# Patient Record
Sex: Female | Born: 1995
Health system: Southern US, Community
[De-identification: ages and names within clinical notes are randomized; demographics above are authoritative.]

## PROBLEM LIST (undated history)

## (undated) DIAGNOSIS — E669 Obesity, unspecified: Secondary | ICD-10-CM

## (undated) DIAGNOSIS — J45909 Unspecified asthma, uncomplicated: Secondary | ICD-10-CM

## (undated) DIAGNOSIS — E039 Hypothyroidism, unspecified: Secondary | ICD-10-CM

## (undated) DIAGNOSIS — L309 Dermatitis, unspecified: Secondary | ICD-10-CM

## (undated) DIAGNOSIS — R7303 Prediabetes: Secondary | ICD-10-CM

## (undated) DIAGNOSIS — E559 Vitamin D deficiency, unspecified: Secondary | ICD-10-CM

## (undated) DIAGNOSIS — T7840XA Allergy, unspecified, initial encounter: Secondary | ICD-10-CM

## (undated) DIAGNOSIS — M549 Dorsalgia, unspecified: Secondary | ICD-10-CM

## (undated) HISTORY — DX: Obesity, unspecified: E66.9

## (undated) HISTORY — PX: NO PAST SURGERIES: SHX2092

## (undated) HISTORY — DX: Vitamin D deficiency, unspecified: E55.9

## (undated) HISTORY — DX: Dorsalgia, unspecified: M54.9

## (undated) HISTORY — DX: Hypothyroidism, unspecified: E03.9

## (undated) HISTORY — DX: Allergy, unspecified, initial encounter: T78.40XA

## (undated) HISTORY — DX: Prediabetes: R73.03

---
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
## 2014-10-19 ENCOUNTER — Encounter (HOSPITAL_COMMUNITY): Payer: Self-pay | Admitting: *Deleted

## 2014-10-19 ENCOUNTER — Emergency Department (HOSPITAL_COMMUNITY)
Admission: EM | Admit: 2014-10-19 | Discharge: 2014-10-19 | Disposition: A | Discharge status: Home / Self Care | Payer: 59 | Attending: Emergency Medicine | Admitting: Emergency Medicine

## 2014-10-19 DIAGNOSIS — Y929 Unspecified place or not applicable: Secondary | ICD-10-CM | POA: Insufficient documentation

## 2014-10-19 DIAGNOSIS — Z79899 Other long term (current) drug therapy: Secondary | ICD-10-CM | POA: Insufficient documentation

## 2014-10-19 DIAGNOSIS — R21 Rash and other nonspecific skin eruption: Secondary | ICD-10-CM | POA: Diagnosis not present

## 2014-10-19 DIAGNOSIS — J45901 Unspecified asthma with (acute) exacerbation: Secondary | ICD-10-CM | POA: Insufficient documentation

## 2014-10-19 DIAGNOSIS — R000 Tachycardia, unspecified: Secondary | ICD-10-CM | POA: Diagnosis not present

## 2014-10-19 DIAGNOSIS — T7840XA Allergy, unspecified, initial encounter: Secondary | ICD-10-CM | POA: Insufficient documentation

## 2014-10-19 DIAGNOSIS — Y9389 Activity, other specified: Secondary | ICD-10-CM | POA: Insufficient documentation

## 2014-10-19 DIAGNOSIS — Z872 Personal history of diseases of the skin and subcutaneous tissue: Secondary | ICD-10-CM | POA: Diagnosis not present

## 2014-10-19 DIAGNOSIS — Y998 Other external cause status: Secondary | ICD-10-CM | POA: Diagnosis not present

## 2014-10-19 DIAGNOSIS — X58XXXA Exposure to other specified factors, initial encounter: Secondary | ICD-10-CM | POA: Diagnosis not present

## 2014-10-19 MED ORDER — DIPHENHYDRAMINE HCL 50 MG/ML IJ SOLN
50.0000 mg | Freq: Once | INTRAMUSCULAR | Status: AC
Start: 2014-10-19 — End: 2014-10-19
  Administered 2014-10-19: 50 mg via INTRAVENOUS
  Filled 2014-10-19: qty 1

## 2014-10-19 MED ORDER — SODIUM CHLORIDE 0.9 % IV SOLN
Freq: Once | INTRAVENOUS | Status: AC
  Administered 2014-10-19: 22:00:00 via INTRAVENOUS

## 2014-10-19 MED ORDER — DEXAMETHASONE SODIUM PHOSPHATE 10 MG/ML IJ SOLN
10.0000 mg | Freq: Once | INTRAMUSCULAR | Status: AC
  Administered 2014-10-19: 10 mg via INTRAVENOUS
  Filled 2014-10-19: qty 1

## 2014-10-19 MED ORDER — EPINEPHRINE 0.3 MG/0.3ML IJ SOAJ
0.3000 mg | Freq: Once | INTRAMUSCULAR | Status: AC
  Administered 2014-10-19: 0.3 mg via INTRAMUSCULAR
  Filled 2014-10-19: qty 0.3

## 2014-10-19 NOTE — ED Notes (Signed)
Fine raised rash over her body only itching on the lt side of her face along the jaw line.  No swelling of her tongue or in the back of her throat at present.  This began  approx 1930.Marland Kitchen.  Pt slow to respond to questions asked??????

## 2014-10-19 NOTE — ED Provider Notes (Signed)
CSN: [id number]     Arrival date & time 10/19/14  2042 History   First MD Initiated Contact with Patient 10/19/14 2101     Chief Complaint  Patient presents with  . Allergic Reaction    HPI Pt arrives to ED c/o allergic reaction. States she ate a burger with tomatoe and lettuce. Pts family states she called them and said her tongue was swollen and she was feeling SOB. Family brought her to ED. Pt states she took benadryl. Pt is lethargic, has hives and tongue is WNL. Past Medical History  Diagnosis Date  . Asthma   . Eczema    History reviewed. No pertinent past surgical history. No family history on file. History  Substance Use Topics  . Smoking status: Never Smoker   . Smokeless tobacco: Not on file  . Alcohol Use: No   OB History    No data available     Review of Systems  All other systems reviewed and are negative  Allergies  Shellfish allergy  Home Medications   Prior to Admission medications   Medication Sig Start Date End Date Taking? Authorizing Provider  albuterol (PROVENTIL) (2.5 MG/3ML) 0.083% nebulizer solution Take 2.5 mg by nebulization every 6 (six) hours as needed for wheezing or shortness of breath.   Yes Historical Provider, MD  diphenhydrAMINE (BENADRYL) 25 mg capsule Take 25 mg by mouth every 6 (six) hours as needed for allergies. For allegies   Yes Historical Provider, MD  albuterol (PROVENTIL HFA;VENTOLIN HFA) 108 (90 BASE) MCG/ACT inhaler Inhale 1-2 puffs into the lungs every 6 (six) hours as needed for wheezing. 08/13/12   Linna Hoff, MD  methylPREDNISolone (MEDROL DOSEPAK) 4 MG tablet follow package directions, take until finished, start on tues. 08/13/12   Linna Hoff, MD   BP 108/50 mmHg  Pulse 101  Temp(Src) 98.1 F (36.7 C) (Oral)  Resp 15  Ht  (1.626 m)  Wt 200 lb (90.719 kg)  BMI 34.31 kg/m2  SpO2 96% Physical Exam  Constitutional: She is oriented to person, place, and time. She appears well-developed and well-nourished. No  distress.  HENT:  Head: Normocephalic and atraumatic.  Eyes: Pupils are equal, round, and reactive to light.  Neck: Normal range of motion.  Cardiovascular: Intact distal pulses.  Tachycardia present.   Pulmonary/Chest: No respiratory distress. She has wheezes.  Abdominal: Normal appearance. She exhibits no distension.  Musculoskeletal: Normal range of motion.  Neurological: She is alert and oriented to person, place, and time. No cranial nerve deficit.  Skin: Skin is warm and dry. Rash (Urticarial) noted.  Psychiatric: She has a normal mood and affect. Her behavior is normal.  Nursing note and vitals reviewed.   ED Course  Procedures (including critical care time) Medications  diphenhydrAMINE (BENADRYL) injection 50 mg (50 mg Intravenous Given 10/19/14 2106)  dexamethasone (DECADRON) injection 10 mg (10 mg Intravenous Given 10/19/14 2107)  EPINEPHrine (EPI-PEN) injection 0.3 mg (0.3 mg Intramuscular Given 10/19/14 2105)  0.9 %  sodium chloride infusion ( Intravenous Stopped 10/19/14 2248)    Labs Review Labs Reviewed - No data to display  Imaging Review No results found.   EKG Interpretation   Date/Time:  Sunday October 19 2014 20:53:28 EDT Ventricular Rate:  100 PR Interval:  154 QRS Duration: 88 QT Interval:  347 QTC Calculation: 447 R Axis:   62 Text Interpretation:  Sinus tachycardia Confirmed by Lolah Coghlan  MD, Mattis Featherly  ([id number]) on 10/19/2014 9:01:51 PM     After  treatment in the ED the patient feels back to baseline and wants to go home. MDM   Final diagnoses:  Allergic reaction, initial encounter        Nelva Nayobert Leyanna Bittman, MD 10/23/14 1529

## 2014-10-19 NOTE — ED Notes (Signed)
The pt has a headache since she was at work

## 2014-10-19 NOTE — ED Notes (Signed)
Pt arrives to ED c/o allergic reaction. States she ate a burger with tomatoe and lettuce. Pts family states she called them and said her tongue was swollen and she was feeling SOB. Family brought her to ED. Pt states she took benadryl. Pt is lethargic, has hives and tongue is WNL.

## 2014-10-19 NOTE — Discharge Instructions (Signed)
Food Allergy °A food allergy occurs from eating something you are sensitive to. Food allergies occur in all age groups. It may be passed to you from your parents (heredity).  °CAUSES  °Some common causes are cow's milk, seafood, eggs, nuts (including peanut butter), wheat, and soybeans. °SYMPTOMS  °Common problems are:  °· Swelling around the mouth. °· An itchy, red rash. °· Hives. °· Vomiting. °· Diarrhea. °Severe allergic reactions are life-threatening. This reaction is called anaphylaxis. It can cause the mouth and throat to swell. This makes it hard to breathe and swallow. In severe reactions, only a small amount of food may be fatal within seconds. °HOME CARE INSTRUCTIONS  °· If you are unsure what caused the reaction, keep a diary of foods eaten and symptoms that followed. Avoid foods that cause reactions. °· If hives or rash are present: °¨ Take medicines as directed. °¨ Use an over-the-counter antihistamine (diphenhydramine) to treat hives and itching as needed. °¨ Apply cold compresses to the skin or take baths in cool water. Avoid hot baths or showers. These will increase the redness and itching. °· If you are severely allergic: °¨ Hospitalization is often required following a severe reaction. °¨ Wear a medical alert bracelet or necklace that describes the allergy. °¨ Carry your anaphylaxis kit or epinephrine injection with you at all times. Both you and your family members should know how to use this. This can be lifesaving if you have a severe reaction. If epinephrine is used, it is important for you to seek immediate medical care or call your local emergency services (911 in [place] When the epinephrine wears off, it can be followed by a delayed reaction, which can be fatal. °· Replace your epinephrine immediately after use in case of another reaction. °· Ask your caregiver for instructions if you have not been taught how to use an epinephrine injection. °· Do not drive until medicines used to treat the  reaction have worn off, unless approved by your caregiver. °SEEK MEDICAL CARE IF:  °· You suspect a food allergy. Symptoms generally happen within 30 minutes of eating a food. °· Your symptoms have not gone away within 2 days. See your caregiver sooner if symptoms are getting worse. °· You develop new symptoms. °· You want to retest yourself with a food or drink you think causes an allergic reaction. Never do this if an anaphylactic reaction to that food or drink has happened before. °· There is a return of the symptoms which brought you to your caregiver. °SEEK IMMEDIATE MEDICAL CARE IF:  °· You have trouble breathing, are wheezing, or you have a tight feeling in your chest or throat. °· You have a swollen mouth, or you have hives, swelling, or itching all over your body. Use your epinephrine injection immediately. This is given into the outside of your thigh, deep into the muscle. Following use of the epinephrine injection, seek help right away. °Seek immediate medical care or call your local emergency services (911 in [place] °MAKE SURE YOU:  °· Understand these instructions. °· Will watch your condition. °· Will get help right away if you are not doing well or get worse. °Document Released: 07/01/2000 Document Revised: 09/26/2011 Document Reviewed: 02/21/2008 °Exit[employer]® Patient Information ©2015 [employer]. This information is not intended to replace advice given to you by your health care provider. Make sure you discuss any questions you have with your health care provider. ° °

## 2014-10-19 NOTE — ED Notes (Signed)
bp remains a little low.  nss added to pt 17600ml/hr

## 2014-12-01 IMAGING — CR DG CHEST 2V
2 series · 2 of 2 positions shown · non-contrast
Comparison: None

CLINICAL DATA: Cough and shortness of breath.

CHEST - 2 VIEW

[view not recorded (1 of 2)]
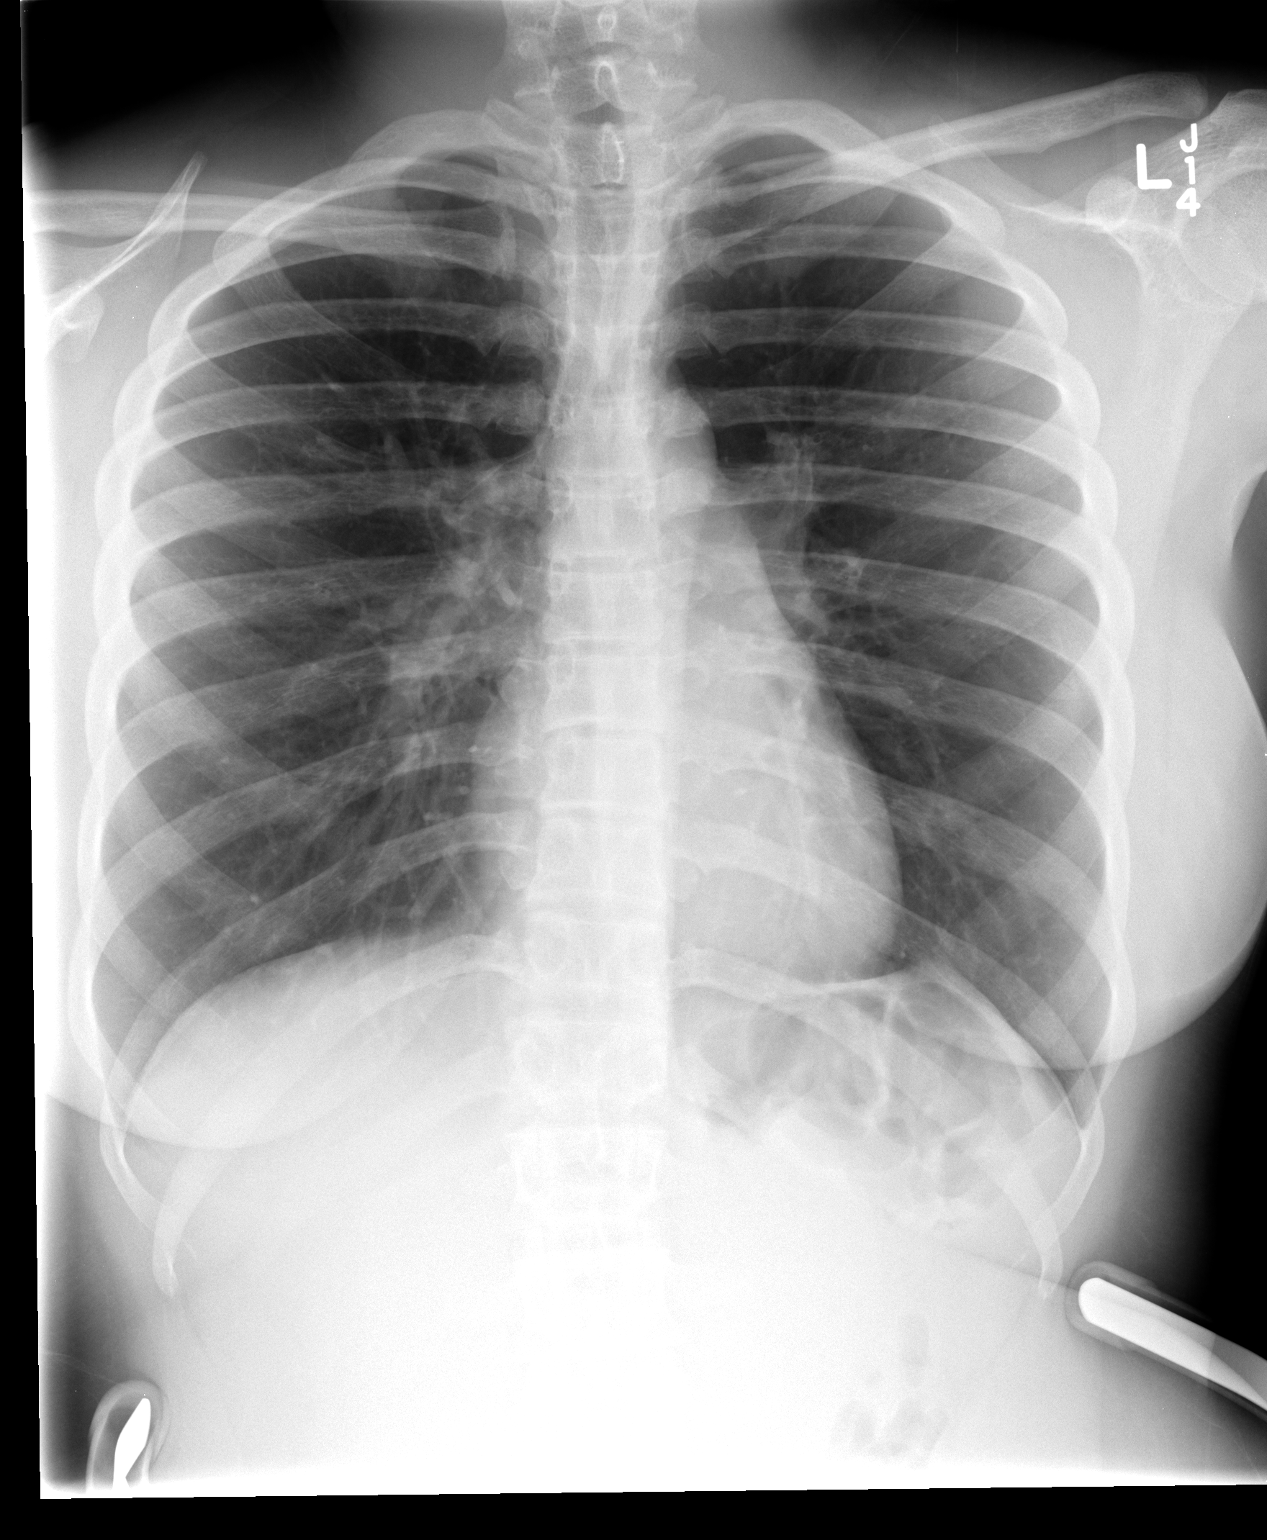

[view not recorded (2 of 2)]
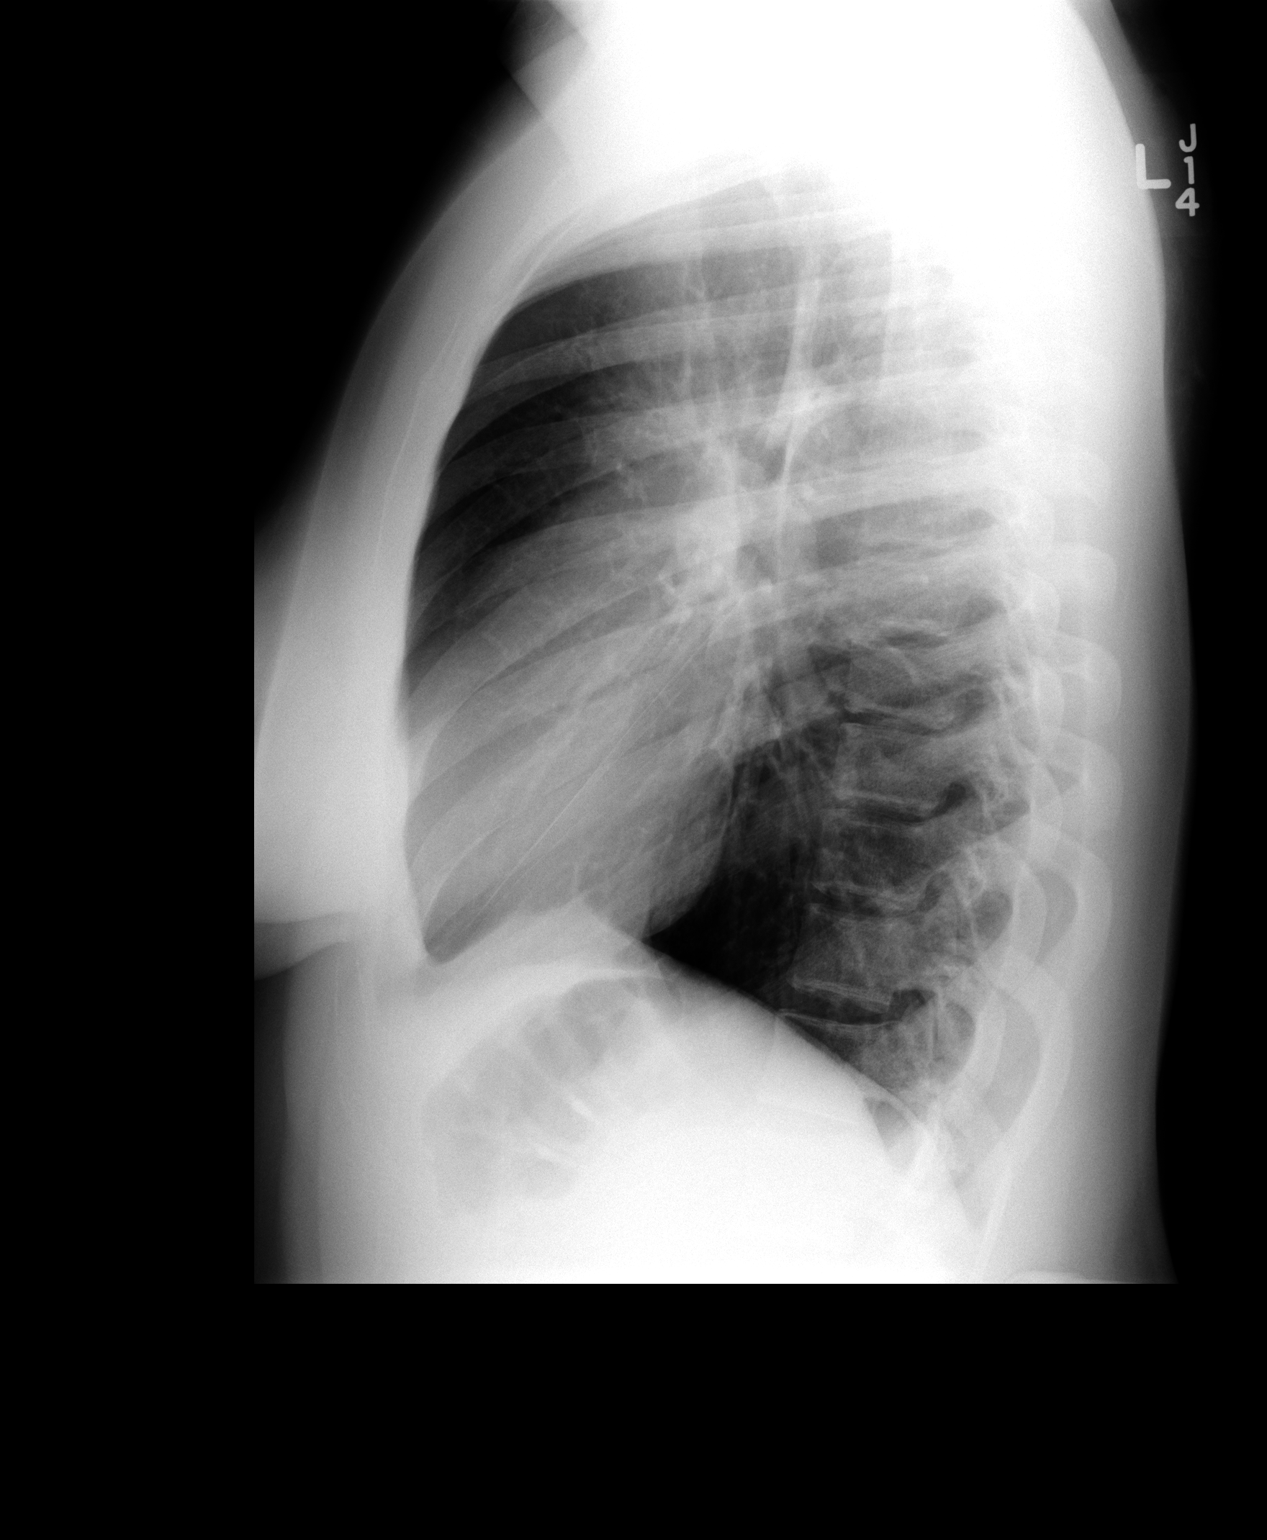

[2 of 2 positions shown; findings below may reference images not displayed]

FINDINGS: The cardiac silhouette, mediastinal and hilar contours
are normal.  The lungs are clear.  Mild hyperinflation. Mild
peribronchial thickening.  No pleural effusion.  The bony thorax is
intact.
IMPRESSION: No pulmonary infiltrates.  Mild hyperinflation and peribronchial
thickening.

## 2015-03-06 ENCOUNTER — Encounter (HOSPITAL_COMMUNITY): Payer: Self-pay | Admitting: Emergency Medicine

## 2015-03-06 ENCOUNTER — Emergency Department (HOSPITAL_COMMUNITY)
Admission: EM | Admit: 2015-03-06 | Discharge: 2015-03-06 | Disposition: A | Discharge status: Home / Self Care | Payer: Self-pay | Attending: Emergency Medicine | Admitting: Emergency Medicine

## 2015-03-06 DIAGNOSIS — Z79899 Other long term (current) drug therapy: Secondary | ICD-10-CM | POA: Insufficient documentation

## 2015-03-06 DIAGNOSIS — Y998 Other external cause status: Secondary | ICD-10-CM | POA: Insufficient documentation

## 2015-03-06 DIAGNOSIS — T782XXA Anaphylactic shock, unspecified, initial encounter: Secondary | ICD-10-CM | POA: Insufficient documentation

## 2015-03-06 DIAGNOSIS — Z872 Personal history of diseases of the skin and subcutaneous tissue: Secondary | ICD-10-CM | POA: Insufficient documentation

## 2015-03-06 DIAGNOSIS — X58XXXA Exposure to other specified factors, initial encounter: Secondary | ICD-10-CM | POA: Insufficient documentation

## 2015-03-06 DIAGNOSIS — Y9389 Activity, other specified: Secondary | ICD-10-CM | POA: Insufficient documentation

## 2015-03-06 DIAGNOSIS — Y9289 Other specified places as the place of occurrence of the external cause: Secondary | ICD-10-CM | POA: Insufficient documentation

## 2015-03-06 DIAGNOSIS — J45909 Unspecified asthma, uncomplicated: Secondary | ICD-10-CM | POA: Insufficient documentation

## 2015-03-06 MED ORDER — SODIUM CHLORIDE 0.9 % IV BOLUS (SEPSIS)
1000.0000 mL | Freq: Once | INTRAVENOUS | Status: AC
  Administered 2015-03-06: 1000 mL via INTRAVENOUS

## 2015-03-06 MED ORDER — SODIUM CHLORIDE 0.9 % IV BOLUS (SEPSIS)
1500.0000 mL | Freq: Once | INTRAVENOUS | Status: AC
  Administered 2015-03-06: 1500 mL via INTRAVENOUS

## 2015-03-06 MED ORDER — EPINEPHRINE 0.3 MG/0.3ML IJ SOAJ: 0.3000 mg | Freq: Once | INTRAMUSCULAR | Status: DC

## 2015-03-06 NOTE — ED Notes (Signed)
Pt stable, ambulatory, states understanding of discharge instructions 

## 2015-03-06 NOTE — ED Notes (Addendum)
Per EMS: Was eating alfredo, suddenly had hives and weakness, allergie to peanuts and tomatoes.  First BP 60 palpated, gave IV fluids, weak radial pulses, nausea. After 500 cc and the epi 0.3 mg IM given, 125 solumedrol, 50 benadryl, 50 zantac, patient's bp increased to 110 systolic.  Great radials now per ems.

## 2015-03-06 NOTE — Discharge Instructions (Signed)
Anaphylactic Reaction °An anaphylactic reaction is a sudden, severe allergic reaction. It affects the whole body. It can be life threatening. You may need to stay in the hospital.  °HOME CARE °· Wear a medical bracelet or necklace that lists your allergy. °· Carry your allergy kit or medicine shot to treat severe allergic reactions with you. These can save your life. °· Do not drive until medicine from your shot has worn off, unless your doctor says it is okay. °· If you have hives or a rash: °¨ Take medicine as told by your doctor. °¨ You may take over-the-counter antihistamine medicine. °¨ Place cold cloths on your skin. Take baths in cool water. Avoid hot baths and hot showers. °GET HELP RIGHT AWAY IF:  °· Your mouth is puffy (swollen), or you have trouble breathing. °· You start making whistling sounds when you breathe (wheezing). °· You have a tight feeling in your chest or throat. °· You have a rash, hives, puffiness, or itching on your body. °· You throw up (vomit) or have watery poop (diarrhea). °· You feel dizzy or pass out (faint). °· You think you are having an allergic reaction. °· You have new symptoms. °This is an emergency. Use your medicine shot or allergy kit as told. Call your local emergency services (911 in [place] Even if you feel better after the shot, you need to go to the hospital emergency department. °MAKE SURE YOU:  °· Understand these instructions. °· Will watch your condition. °· Will get help right away if you are not doing well or get worse. °Document Released: 12/21/2007 Document Revised: 01/03/2012 Document Reviewed: 10/05/2011 °[employer]® Patient Information ©2015 [employer]. This information is not intended to replace advice given to you by your health care provider. Make sure you discuss any questions you have with your health care provider. ° °

## 2015-03-06 NOTE — ED Provider Notes (Signed)
History   Chief Complaint  Patient presents with  . Allergic Reaction     HPI 19 year old female past history of asthma presents to ED after allergic reaction. Patient reports eating chicken Alfredo when she began having bilateral upper extremity hives and LH. Patient denies having any trouble breathing, throat swelling/itching, nausea, vomiting, abdominal pain, syncope. EMS was called on their arrival patient blood pressure 60/40. Patient was given IM epi, IV fluids, Zantac, Benadryl, Solu-Medrol. Patient remained stable during transport and blood pressure improved to 100 systolic. In ED, patient reports she is feeling better now. She reports her arm still feels itchy. She denies having other symptoms currently. She states she has had a similar episode a few months ago while eating a cheeseburger and the cause was never discovered. Never rx'd epi pen. Prior to this in usual state of health.  Past medical/surgical history, social history, medications, allergies and FH have been reviewed with patient and/or in documentation. Furthermore, if pt family or friend(s) present, additional historical information was obtained from them.  Past Medical History  Diagnosis Date  . Asthma   . Eczema    History reviewed. No pertinent past surgical history. No family history on file. Social History  Substance Use Topics  . Smoking status: Never Smoker   . Smokeless tobacco: None  . Alcohol Use: No     Review of Systems Constitutional: - F/C, -fatigue.  HENT: - congestion, -rhinorrhea, -sore throat.   Eyes: - eye pain, -visual disturbance.  Respiratory: - cough, -SOB, -hemoptysis.   Cardiovascular: - CP, -palps.  Gastrointestinal: - N/V/D, -abd pain  Genitourinary: - flank pain, -dysuria, -frequency.  Musculoskeletal: - myalgia/arthritis, -joint swelling, -gait abnormality, -back pain, -neck pain/stiffness, -leg pain/swelling.  Skin: +rash Neurological: - focal weakness, +ightheadedness,  -dizziness, -numbness, -HA.  All other systems reviewed and are negative.   Physical Exam  Physical Exam  ED Triage Vitals  Enc Vitals Group     BP 03/06/15 1748 99/47 mmHg     Pulse Rate 03/06/15 1748 83     Resp 03/06/15 1748 14     Temp 03/06/15 1748 97.9 F (36.6 C)     Temp Source 03/06/15 1748 Oral     SpO2 03/06/15 1748 100 %     Weight --      Height --      Head Cir --      Peak Flow --      Pain Score 03/06/15 1747 6     Pain Loc --      Pain Edu? --      Excl. in GC? --    Constitutional: Patient is well appearing and in no acute distress Head: Normocephalic and atraumatic.  Eyes: Extraocular motion intact, no scleral icterus Mouth: MMM, OP clear without edema Neck: Supple without meningismus, mass, or overt JVD Respiratory: No respiratory distress. Normal WOB. No w/r/g. No stridor CV: RRR, no obvious murmurs.  Pulses +2 and symmetric. Euvolemic Abdomen: Soft, NT, ND, no r/g. No mass.  MSK: Extremities are atraumatic without deformity, ROM intact Skin: Warm, dry, intact. Warm red hives to BLUs. Neuro: AAOx4, MAE 5/5 sym, no focal deficit noted   ED Course  Procedures   Labs Reviewed - No data to display I personally reviewed and interpreted all labs.  No results found. I personally viewed above image(s) which were used in my medical decision making. Formal interpretations by Radiology.   EKG Interpretation  Date/Time:    Ventricular Rate:  PR Interval:    QRS Duration:   QT Interval:    QTC Calculation:   R Axis:     Text Interpretation:         MDM: Cassandra Foley is a 19 y.o. female with H&P as above who p/w CC: allergic rxn  Clinical picture c/f anaphylaxis. Pt is s/p epi by EMS. Pt is well appearing now and BP remains soft. Will continue to give IVF and monitor. Benadryl given. No indication for repeat epi at this time.  After 3L IVF, pt BP stable and pt reports 100% better. Pt has been observed for several hrs since epi and is  now stable for d/c. Advised f/u close with pcp regarding allergy testing. epipen rx given.  Old records reviewed (if available). Labs and imaging reviewed personally by myself and considered in medical decision making if ordered. Clinical Impression: 1. Anaphylaxis, initial encounter     Disposition: Discharge  Condition: Good  I have discussed the results, Dx and Tx plan with the pt(& family if present). He/she/they expressed understanding and agree(s) with the plan. Discharge instructions discussed at great length. Strict return precautions discussed and pt &/or family have verbalized understanding of the instructions. No further questions at time of discharge.    Discharge Medication List as of 03/06/2015  9:08 PM    START taking these medications   Details  EPINEPHrine 0.3 mg/0.3 mL IJ SOAJ injection Inject 0.3 mLs (0.3 mg total) into the muscle once., Starting 03/06/2015, Print        Follow Up: [facility]     [address] [place] [address] [phone]  As needed  [facility] EMERGENCY DEPARTMENT [address] [id number] mc [place] [address] [phone]  If symptoms worsen   Pt seen in conjunction with Dr. Garrel Ridgel, DO [facility] Emergency Medicine Resident - PGY-3      Ames Dura, MD 03/07/15 6644  Arby Barrette, MD 03/12/15 2044

## 2016-03-12 ENCOUNTER — Encounter (HOSPITAL_COMMUNITY): Payer: Self-pay

## 2016-03-12 ENCOUNTER — Emergency Department (HOSPITAL_COMMUNITY)
Admission: EM | Admit: 2016-03-12 | Discharge: 2016-03-12 | Disposition: A | Discharge status: Home / Self Care | Payer: Self-pay | Attending: Emergency Medicine | Admitting: Emergency Medicine

## 2016-03-12 DIAGNOSIS — Z331 Pregnant state, incidental: Secondary | ICD-10-CM | POA: Insufficient documentation

## 2016-03-12 DIAGNOSIS — R519 Headache, unspecified: Secondary | ICD-10-CM

## 2016-03-12 DIAGNOSIS — Z3490 Encounter for supervision of normal pregnancy, unspecified, unspecified trimester: Secondary | ICD-10-CM

## 2016-03-12 DIAGNOSIS — J45909 Unspecified asthma, uncomplicated: Secondary | ICD-10-CM | POA: Insufficient documentation

## 2016-03-12 DIAGNOSIS — Z91010 Allergy to peanuts: Secondary | ICD-10-CM | POA: Insufficient documentation

## 2016-03-12 DIAGNOSIS — R51 Headache: Secondary | ICD-10-CM

## 2016-03-12 LAB — POC URINE PREG, ED: PREG TEST UR: POSITIVE — AB

## 2016-03-12 MED ORDER — METOCLOPRAMIDE HCL 10 MG PO TABS: 10.0000 mg | ORAL_TABLET | Freq: Three times a day (TID) | ORAL | 0 refills | Status: DC | PRN

## 2016-03-12 NOTE — ED Triage Notes (Signed)
Pt. Began having intermittent headaches for over 1 month.  Pt reports being unders stress and feeling anxxiouls  She also has a lot of things going on in her life.  She also could be pregnant.  Pt. Reports taking Ibuprofen which helps a little.  Last night the headache was worse and she was unable to sleep.  She denies any n/v/d denies any blurred vision.  She has photphobia

## 2016-03-12 NOTE — ED Provider Notes (Signed)
[facility]-EMERGENCY DEPT Provider Note   CSN: [id number] Arrival date & time: 03/12/16  1556     History   Chief Complaint Chief Complaint  Patient presents with  . Migraine    HPI Cassandra Foley is a 20 y.o. female.   Migraine  Associated symptoms include headaches.   Pt has been having trouble with a "migraine" for the last month off and on.  She normally does not have headaches. She tried to make an appointment but it is not until the 17th.  No fevers.  No vomiting.  She gets the headache in the front of her head.  She feels like it increases when she has stress or increased activity.    Pt stopped her birth control medications.  She has had intercourse since then.   Past Medical History:  Diagnosis Date  . Asthma   . Eczema     There are no active problems to display for this patient.   History reviewed. No pertinent surgical history.  OB History    No data available       Home Medications    Prior to Admission medications   Medication Sig Start Date End Date Taking? Authorizing Provider  acetaminophen (TYLENOL) 500 MG tablet Take 1,000 mg by mouth every 6 (six) hours as needed for headache.    Historical Provider, MD  albuterol (PROVENTIL HFA;VENTOLIN HFA) 108 (90 BASE) MCG/ACT inhaler Inhale 1-2 puffs into the lungs every 6 (six) hours as needed for wheezing. 08/13/12   Linna HoffJames D Kindl, MD  albuterol (PROVENTIL) (2.5 MG/3ML) 0.083% nebulizer solution Take 2.5 mg by nebulization every 6 (six) hours as needed for wheezing or shortness of breath.    Historical Provider, MD  diphenhydrAMINE (BENADRYL) 25 mg capsule Take 25 mg by mouth every 6 (six) hours as needed for allergies. For allegies    Historical Provider, MD  EPINEPHrine 0.3 mg/0.3 mL IJ SOAJ injection Inject 0.3 mLs (0.3 mg total) into the muscle once. 03/06/15   Ames DuraStephen Balleh, MD  methylPREDNISolone (MEDROL DOSEPAK) 4 MG tablet follow package directions, take until finished, start on tues. 08/13/12   Linna HoffJames  D Kindl, MD  metoCLOPramide (REGLAN) 10 MG tablet Take 1 tablet (10 mg total) by mouth every 8 (eight) hours as needed for nausea. 03/12/16   Linwood DibblesJon Crystelle Ferrufino, MD  montelukast (SINGULAIR) 10 MG tablet Take 10 mg by mouth daily. 01/06/15   Historical Provider, MD    Family History No family history on file.  Social History Social History  Substance Use Topics  . Smoking status: Never Smoker  . Smokeless tobacco: Never Used  . Alcohol use No     Allergies   Shellfish allergy; Peanut-containing drug products; and Tomato   Review of Systems Review of Systems  Constitutional: Negative for fever.  Gastrointestinal: Negative for nausea and vomiting.  Genitourinary:       Menses have been irregular.  Last was end of June beginning of July  Neurological: Positive for headaches. Negative for tremors and seizures.  All other systems reviewed and are negative.    Physical Exam Updated Vital Signs BP (!) 107/50 (BP Location: Left Arm)   Pulse 77   Temp 98.2 F (36.8 C) (Oral)   Resp 16   Wt 107.6 kg   LMP 01/16/2016   SpO2 100%   BMI 40.70 kg/m   Physical Exam  Constitutional: She appears well-developed and well-nourished. No distress.  HENT:  Head: Normocephalic and atraumatic.  Right Ear: External ear normal.  Left Ear: External ear normal.  Eyes: Conjunctivae are normal. Right eye exhibits no discharge. Left eye exhibits no discharge. No scleral icterus.  Neck: Neck supple. No tracheal deviation present.  Cardiovascular: Normal rate, regular rhythm and intact distal pulses.   Pulmonary/Chest: Effort normal and breath sounds normal. No stridor. No respiratory distress. She has no wheezes. She has no rales.  Abdominal: Soft. Bowel sounds are normal. She exhibits no distension. There is no tenderness. There is no rebound and no guarding.  Musculoskeletal: She exhibits no edema or tenderness.  Neurological: She is alert. She has normal strength. No cranial nerve deficit (no facial  droop, extraocular movements intact, no slurred speech) or sensory deficit. She exhibits normal muscle tone. She displays no seizure activity. Coordination normal.  Skin: Skin is warm and dry. No rash noted.  Psychiatric: She has a normal mood and affect.  Nursing note and vitals reviewed.    ED Treatments / Results  Labs (all labs ordered are listed, but only abnormal results are displayed) Labs Reviewed  POC URINE PREG, ED - Abnormal; Notable for the following:       Result Value   Preg Test, Ur POSITIVE (*)    All other components within normal limits    EKG  EKG Interpretation None       Radiology No results found.  Procedures Procedures (including critical care time)  Medications Ordered in ED Medications - No data to display   Initial Impression / Assessment and Plan / ED Course  I have reviewed the triage vital signs and the nursing notes.  Pertinent labs & imaging results that were available during my care of the patient were reviewed by me and considered in my medical decision making (see chart for details).  Clinical Course    Patient has a reassuring exam. Her headache is mild to moderate in nature. She is not having any neck stiffness. She has a normal neurologic exam.  I doubt meningitis, hemorrhage, tumor or other emergent condition.  Her pregnancy test is positive and fortunately she is not having any symptoms to suggest any complications. She denies any abdominal pain. No bleeding.  I explained to patient it safe for her to take Tylenol during pregnancy for her headache. Recommend she follow up with an OB doctor. I'll give her prescription for Reglan which could help if she starts to develop nausea and may help her with her headaches.  Final Clinical Impressions(s) / ED Diagnoses   Final diagnoses:  Frequent headaches  Pregnancy    New Prescriptions New Prescriptions   METOCLOPRAMIDE (REGLAN) 10 MG TABLET    Take 1 tablet (10 mg total) by mouth  every 8 (eight) hours as needed for nausea.     Linwood Dibbles, MD 03/12/16 [id number]

## 2016-03-12 NOTE — Discharge Instructions (Signed)
You can safely take Tylenol for your headache. I'll up with an OB/GYN doctor regarding her pregnancy. Return for fever or vaginal bleeding or other concerning symptoms

## 2016-04-13 ENCOUNTER — Ambulatory Visit (INDEPENDENT_AMBULATORY_CARE_PROVIDER_SITE_OTHER): Payer: Medicaid Other | Admitting: Obstetrics and Gynecology

## 2016-04-13 ENCOUNTER — Encounter: Payer: Self-pay | Admitting: Obstetrics and Gynecology

## 2016-04-13 VITALS — BP 117/74 | HR 83 | Temp 98.5°F | Wt 240.0 lb

## 2016-04-13 DIAGNOSIS — O99212 Obesity complicating pregnancy, second trimester: Secondary | ICD-10-CM | POA: Diagnosis not present

## 2016-04-13 DIAGNOSIS — O99210 Obesity complicating pregnancy, unspecified trimester: Secondary | ICD-10-CM | POA: Insufficient documentation

## 2016-04-13 DIAGNOSIS — Z3400 Encounter for supervision of normal first pregnancy, unspecified trimester: Secondary | ICD-10-CM | POA: Insufficient documentation

## 2016-04-13 DIAGNOSIS — Z3402 Encounter for supervision of normal first pregnancy, second trimester: Secondary | ICD-10-CM | POA: Diagnosis not present

## 2016-04-13 NOTE — Progress Notes (Signed)
Patient refused Flu shot °

## 2016-04-13 NOTE — Progress Notes (Signed)
Patient is not sure about date of LMP, [facility].

## 2016-04-13 NOTE — Patient Instructions (Addendum)
Second Trimester of Pregnancy The second trimester is from week 13 through week 28, months 4 through 6. The second trimester is often a time when you feel your best. Your body has also adjusted to being pregnant, and you begin to feel better physically. Usually, morning sickness has lessened or quit completely, you may have more energy, and you may have an increase in appetite. The second trimester is also a time when the fetus is growing rapidly. At the end of the sixth month, the fetus is about 9 inches long and weighs about 1 pounds. You will likely begin to feel the baby move (quickening) between 18 and 20 weeks of the pregnancy. BODY CHANGES Your body goes through many changes during pregnancy. The changes vary from woman to woman.   Your weight will continue to increase. You will notice your lower abdomen bulging out.  You may begin to get stretch marks on your hips, abdomen, and breasts.  You may develop headaches that can be relieved by medicines approved by your health care provider.  You may urinate more often because the fetus is pressing on your bladder.  You may develop or continue to have heartburn as a result of your pregnancy.  You may develop constipation because certain hormones are causing the muscles that push waste through your intestines to slow down.  You may develop hemorrhoids or swollen, bulging veins (varicose veins).  You may have back pain because of the weight gain and pregnancy hormones relaxing your joints between the bones in your pelvis and as a result of a shift in weight and the muscles that support your balance.  Your breasts will continue to grow and be tender.  Your gums may bleed and may be sensitive to brushing and flossing.  Dark spots or blotches (chloasma, mask of pregnancy) may develop on your face. This will likely fade after the baby is born.  A dark line from your belly button to the pubic area (linea nigra) may appear. This will likely  fade after the baby is born.  You may have changes in your hair. These can include thickening of your hair, rapid growth, and changes in texture. Some women also have hair loss during or after pregnancy, or hair that feels dry or thin. Your hair will most likely return to normal after your baby is born. WHAT TO EXPECT AT YOUR PRENATAL VISITS During a routine prenatal visit:  You will be weighed to make sure you and the fetus are growing normally.  Your blood pressure will be taken.  Your abdomen will be measured to track your baby's growth.  The fetal heartbeat will be listened to.  Any test results from the previous visit will be discussed. Your health care provider may ask you:  How you are feeling.  If you are feeling the baby move.  If you have had any abnormal symptoms, such as leaking fluid, bleeding, severe headaches, or abdominal cramping.  If you are using any tobacco products, including cigarettes, chewing tobacco, and electronic cigarettes.  If you have any questions. Other tests that may be performed during your second trimester include:  Blood tests that check for:  Low iron levels (anemia).  Gestational diabetes (between 24 and 28 weeks).  Rh antibodies.  Urine tests to check for infections, diabetes, or protein in the urine.  An ultrasound to confirm the proper growth and development of the baby.  An amniocentesis to check for possible genetic problems.  Fetal screens for spina bifida   and Down syndrome.  HIV (human immunodeficiency virus) testing. Routine prenatal testing includes screening for HIV, unless you choose not to have this test. HOME CARE INSTRUCTIONS   Avoid all smoking, herbs, alcohol, and unprescribed drugs. These chemicals affect the formation and growth of the baby.  Do not use any tobacco products, including cigarettes, chewing tobacco, and electronic cigarettes. If you need help quitting, ask your health care provider. You may receive  counseling support and other resources to help you quit.  Follow your health care provider's instructions regarding medicine use. There are medicines that are either safe or unsafe to take during pregnancy.  Exercise only as directed by your health care provider. Experiencing uterine cramps is a good sign to stop exercising.  Continue to eat regular, healthy meals.  Wear a good support bra for breast tenderness.  Do not use hot tubs, steam rooms, or saunas.  Wear your seat belt at all times when driving.  Avoid raw meat, uncooked cheese, cat litter boxes, and soil used by cats. These carry germs that can cause birth defects in the baby.  Take your prenatal vitamins.  Take 1500-2000 mg of calcium daily starting at the 20th week of pregnancy until you deliver your baby.  Try taking a stool softener (if your health care provider approves) if you develop constipation. Eat more high-fiber foods, such as fresh vegetables or fruit and whole grains. Drink plenty of fluids to keep your urine clear or pale yellow.  Take warm sitz baths to soothe any pain or discomfort caused by hemorrhoids. Use hemorrhoid cream if your health care provider approves.  If you develop varicose veins, wear support hose. Elevate your feet for 15 minutes, 3-4 times a day. Limit salt in your diet.  Avoid heavy lifting, wear low heel shoes, and practice good posture.  Rest with your legs elevated if you have leg cramps or low back pain.  Visit your dentist if you have not gone yet during your pregnancy. Use a soft toothbrush to brush your teeth and be gentle when you floss.  A sexual relationship may be continued unless your health care provider directs you otherwise.  Continue to go to all your prenatal visits as directed by your health care provider. SEEK MEDICAL CARE IF:   You have dizziness.  You have mild pelvic cramps, pelvic pressure, or nagging pain in the abdominal area.  You have persistent nausea,  vomiting, or diarrhea.  You have a bad smelling vaginal discharge.  You have pain with urination. SEEK IMMEDIATE MEDICAL CARE IF:   You have a fever.  You are leaking fluid from your vagina.  You have spotting or bleeding from your vagina.  You have severe abdominal cramping or pain.  You have rapid weight gain or loss.  You have shortness of breath with chest pain.  You notice sudden or extreme swelling of your face, hands, ankles, feet, or legs.  You have not felt your baby move in over an hour.  You have severe headaches that do not go away with medicine.  You have vision changes.   This information is not intended to replace advice given to you by your health care provider. Make sure you discuss any questions you have with your health care provider.   Document Released: 06/28/2001 Document Revised: 07/25/2014 Document Reviewed: 09/04/2012 Elsevier Interactive Patient Education 2016 [employer]  Contraception Choices Contraception (birth control) is the use of any methods or devices to prevent pregnancy. Below are some methods to   help avoid pregnancy. HORMONAL METHODS   Contraceptive implant. This is a thin, plastic tube containing progesterone hormone. It does not contain estrogen hormone. Your health care provider inserts the tube in the inner part of the upper arm. The tube can remain in place for up to 3 years. After 3 years, the implant must be removed. The implant prevents the ovaries from releasing an egg (ovulation), thickens the cervical mucus to prevent sperm from entering the uterus, and thins the lining of the inside of the uterus.  Progesterone-only injections. These injections are given every 3 months by your health care provider to prevent pregnancy. This synthetic progesterone hormone stops the ovaries from releasing eggs. It also thickens cervical mucus and changes the uterine lining. This makes it harder for sperm to survive in the uterus.  Birth  control pills. These pills contain estrogen and progesterone hormone. They work by preventing the ovaries from releasing eggs (ovulation). They also cause the cervical mucus to thicken, preventing the sperm from entering the uterus. Birth control pills are prescribed by a health care provider.Birth control pills can also be used to treat heavy periods.  Minipill. This type of birth control pill contains only the progesterone hormone. They are taken every day of each month and must be prescribed by your health care provider.  Birth control patch. The patch contains hormones similar to those in birth control pills. It must be changed once a week and is prescribed by a health care provider.  Vaginal ring. The ring contains hormones similar to those in birth control pills. It is left in the vagina for 3 weeks, removed for 1 week, and then a new one is put back in place. The patient must be comfortable inserting and removing the ring from the vagina.A health care provider's prescription is necessary.  Emergency contraception. Emergency contraceptives prevent pregnancy after unprotected sexual intercourse. This pill can be taken right after sex or up to 5 days after unprotected sex. It is most effective the sooner you take the pills after having sexual intercourse. Most emergency contraceptive pills are available without a prescription. Check with your pharmacist. Do not use emergency contraception as your only form of birth control. BARRIER METHODS   Female condom. This is a thin sheath (latex or rubber) that is worn over the penis during sexual intercourse. It can be used with spermicide to increase effectiveness.  Female condom. This is a soft, loose-fitting sheath that is put into the vagina before sexual intercourse.  Diaphragm. This is a soft, latex, dome-shaped barrier that must be fitted by a health care provider. It is inserted into the vagina, along with a spermicidal jelly. It is inserted before  intercourse. The diaphragm should be left in the vagina for 6 to 8 hours after intercourse.  Cervical cap. This is a round, soft, latex or plastic cup that fits over the cervix and must be fitted by a health care provider. The cap can be left in place for up to 48 hours after intercourse.  Sponge. This is a soft, circular piece of polyurethane foam. The sponge has spermicide in it. It is inserted into the vagina after wetting it and before sexual intercourse.  Spermicides. These are chemicals that kill or block sperm from entering the cervix and uterus. They come in the form of creams, jellies, suppositories, foam, or tablets. They do not require a prescription. They are inserted into the vagina with an applicator before having sexual intercourse. The process must be repeated every   time you have sexual intercourse. INTRAUTERINE CONTRACEPTION  Intrauterine device (IUD). This is a T-shaped device that is put in a woman's uterus during a menstrual period to prevent pregnancy. There are 2 types:  Copper IUD. This type of IUD is wrapped in copper wire and is placed inside the uterus. Copper makes the uterus and fallopian tubes produce a fluid that kills sperm. It can stay in place for 10 years.  Hormone IUD. This type of IUD contains the hormone progestin (synthetic progesterone). The hormone thickens the cervical mucus and prevents sperm from entering the uterus, and it also thins the uterine lining to prevent implantation of a fertilized egg. The hormone can weaken or kill the sperm that get into the uterus. It can stay in place for 3-5 years, depending on which type of IUD is used. PERMANENT METHODS OF CONTRACEPTION  Female tubal ligation. This is when the woman's fallopian tubes are surgically sealed, tied, or blocked to prevent the egg from traveling to the uterus.  Hysteroscopic sterilization. This involves placing a small coil or insert into each fallopian tube. Your doctor uses a technique  called hysteroscopy to do the procedure. The device causes scar tissue to form. This results in permanent blockage of the fallopian tubes, so the sperm cannot fertilize the egg. It takes about 3 months after the procedure for the tubes to become blocked. You must use another form of birth control for these 3 months.  Female sterilization. This is when the female has the tubes that carry sperm tied off (vasectomy).This blocks sperm from entering the vagina during sexual intercourse. After the procedure, the man can still ejaculate fluid (semen). NATURAL PLANNING METHODS  Natural family planning. This is not having sexual intercourse or using a barrier method (condom, diaphragm, cervical cap) on days the woman could become pregnant.  Calendar method. This is keeping track of the length of each menstrual cycle and identifying when you are fertile.  Ovulation method. This is avoiding sexual intercourse during ovulation.  Symptothermal method. This is avoiding sexual intercourse during ovulation, using a thermometer and ovulation symptoms.  Post-ovulation method. This is timing sexual intercourse after you have ovulated. Regardless of which type or method of contraception you choose, it is important that you use condoms to protect against the transmission of sexually transmitted infections (STIs). Talk with your health care provider about which form of contraception is most appropriate for you.   This information is not intended to replace advice given to you by your health care provider. Make sure you discuss any questions you have with your health care provider.   Document Released: 07/04/2005 Document Revised: 07/09/2013 Document Reviewed: 12/27/2012 Elsevier Interactive Patient Education 2016 [employer]  Breastfeeding Deciding to breastfeed is one of the best choices you can make for you and your baby. A change in hormones during pregnancy causes your breast tissue to grow and increases the  number and size of your milk ducts. These hormones also allow proteins, sugars, and fats from your blood supply to make breast milk in your milk-producing glands. Hormones prevent breast milk from being released before your baby is born as well as prompt milk flow after birth. Once breastfeeding has begun, thoughts of your baby, as well as his or her sucking or crying, can stimulate the release of milk from your milk-producing glands.  BENEFITS OF BREASTFEEDING For Your Baby  Your first milk (colostrum) helps your baby's digestive system function better.  There are antibodies in your milk that help   your baby fight off infections.  Your baby has a lower incidence of asthma, allergies, and sudden infant death syndrome.  The nutrients in breast milk are better for your baby than infant formulas and are designed uniquely for your baby's needs.  Breast milk improves your baby's brain development.  Your baby is less likely to develop other conditions, such as childhood obesity, asthma, or type 2 diabetes mellitus. For You  Breastfeeding helps to create a very special bond between you and your baby.  Breastfeeding is convenient. Breast milk is always available at the correct temperature and costs nothing.  Breastfeeding helps to burn calories and helps you lose the weight gained during pregnancy.  Breastfeeding makes your uterus contract to its prepregnancy size faster and slows bleeding (lochia) after you give birth.   Breastfeeding helps to lower your risk of developing type 2 diabetes mellitus, osteoporosis, and breast or ovarian cancer later in life. SIGNS THAT YOUR BABY IS HUNGRY Early Signs of Hunger  Increased alertness or activity.  Stretching.  Movement of the head from side to side.  Movement of the head and opening of the mouth when the corner of the mouth or cheek is stroked (rooting).  Increased sucking sounds, smacking lips, cooing, sighing, or squeaking.  Hand-to-mouth  movements.  Increased sucking of fingers or hands. Late Signs of Hunger  Fussing.  Intermittent crying. Extreme Signs of Hunger Signs of extreme hunger will require calming and consoling before your baby will be able to breastfeed successfully. Do not wait for the following signs of extreme hunger to occur before you initiate breastfeeding:  Restlessness.  A loud, strong cry.  Screaming. BREASTFEEDING BASICS Breastfeeding Initiation  Find a comfortable place to sit or lie down, with your neck and back well supported.  Place a pillow or rolled up blanket under your baby to bring him or her to the level of your breast (if you are seated). Nursing pillows are specially designed to help support your arms and your baby while you breastfeed.  Make sure that your baby's abdomen is facing your abdomen.  Gently massage your breast. With your fingertips, massage from your chest wall toward your nipple in a circular motion. This encourages milk flow. You may need to continue this action during the feeding if your milk flows slowly.  Support your breast with 4 fingers underneath and your thumb above your nipple. Make sure your fingers are well away from your nipple and your baby's mouth.  Stroke your baby's lips gently with your finger or nipple.  When your baby's mouth is open wide enough, quickly bring your baby to your breast, placing your entire nipple and as much of the colored area around your nipple (areola) as possible into your baby's mouth.  More areola should be visible above your baby's upper lip than below the lower lip.  Your baby's tongue should be between his or her lower gum and your breast.  Ensure that your baby's mouth is correctly positioned around your nipple (latched). Your baby's lips should create a seal on your breast and be turned out (everted).  It is common for your baby to suck about 2-3 minutes in order to start the flow of breast milk. Latching Teaching  your baby how to latch on to your breast properly is very important. An improper latch can cause nipple pain and decreased milk supply for you and poor weight gain in your baby. Also, if your baby is not latched onto your nipple properly, he   or she may swallow some air during feeding. This can make your baby fussy. Burping your baby when you switch breasts during the feeding can help to get rid of the air. However, teaching your baby to latch on properly is still the best way to prevent fussiness from swallowing air while breastfeeding. Signs that your baby has successfully latched on to your nipple:  Silent tugging or silent sucking, without causing you pain.  Swallowing heard between every 3-4 sucks.  Muscle movement above and in front of his or her ears while sucking. Signs that your baby has not successfully latched on to nipple:  Sucking sounds or smacking sounds from your baby while breastfeeding.  Nipple pain. If you think your baby has not latched on correctly, slip your finger into the corner of your baby's mouth to break the suction and place it between your baby's gums. Attempt breastfeeding initiation again. Signs of Successful Breastfeeding Signs from your baby:  A gradual decrease in the number of sucks or complete cessation of sucking.  Falling asleep.  Relaxation of his or her body.  Retention of a small amount of milk in his or her mouth.  Letting go of your breast by himself or herself. Signs from you:  Breasts that have increased in firmness, weight, and size 1-3 hours after feeding.  Breasts that are softer immediately after breastfeeding.  Increased milk volume, as well as a change in milk consistency and color by the fifth day of breastfeeding.  Nipples that are not sore, cracked, or bleeding. Signs That Your Baby is Getting Enough Milk  Wetting at least 3 diapers in a 24-hour period. The urine should be clear and pale yellow by age 5 days.  At least 3  stools in a 24-hour period by age 5 days. The stool should be soft and yellow.  At least 3 stools in a 24-hour period by age 7 days. The stool should be seedy and yellow.  No loss of weight greater than 10% of birth weight during the first 3 days of age.  Average weight gain of 4-7 ounces (113-198 g) per week after age 4 days.  Consistent daily weight gain by age 5 days, without weight loss after the age of 2 weeks. After a feeding, your baby may spit up a small amount. This is common. BREASTFEEDING FREQUENCY AND DURATION Frequent feeding will help you make more milk and can prevent sore nipples and breast engorgement. Breastfeed when you feel the need to reduce the fullness of your breasts or when your baby shows signs of hunger. This is called "breastfeeding on demand." Avoid introducing a pacifier to your baby while you are working to establish breastfeeding (the first 4-6 weeks after your baby is born). After this time you may choose to use a pacifier. Research has shown that pacifier use during the first year of a baby's life decreases the risk of sudden infant death syndrome (SIDS). Allow your baby to feed on each breast as long as he or she wants. Breastfeed until your baby is finished feeding. When your baby unlatches or falls asleep while feeding from the first breast, offer the second breast. Because newborns are often sleepy in the first few weeks of life, you may need to awaken your baby to get him or her to feed. Breastfeeding times will vary from baby to baby. However, the following rules can serve as a guide to help you ensure that your baby is properly fed:  Newborns (babies 4 weeks   of age or younger) may breastfeed every 1-3 hours.  Newborns should not go longer than 3 hours during the day or 5 hours during the night without breastfeeding.  You should breastfeed your baby a minimum of 8 times in a 24-hour period until you begin to introduce solid foods to your baby at around 6  months of age. BREAST MILK PUMPING Pumping and storing breast milk allows you to ensure that your baby is exclusively fed your breast milk, even at times when you are unable to breastfeed. This is especially important if you are going back to work while you are still breastfeeding or when you are not able to be present during feedings. Your lactation consultant can give you guidelines on how long it is safe to store breast milk. A breast pump is a machine that allows you to pump milk from your breast into a sterile bottle. The pumped breast milk can then be stored in a refrigerator or freezer. Some breast pumps are operated by hand, while others use electricity. Ask your lactation consultant which type will work best for you. Breast pumps can be purchased, but some hospitals and breastfeeding support groups lease breast pumps on a monthly basis. A lactation consultant can teach you how to hand express breast milk, if you prefer not to use a pump. CARING FOR YOUR BREASTS WHILE YOU BREASTFEED Nipples can become dry, cracked, and sore while breastfeeding. The following recommendations can help keep your breasts moisturized and healthy:  Avoid using soap on your nipples.  Wear a supportive bra. Although not required, special nursing bras and tank tops are designed to allow access to your breasts for breastfeeding without taking off your entire bra or top. Avoid wearing underwire-style bras or extremely tight bras.  Air dry your nipples for 3-4minutes after each feeding.  Use only cotton bra pads to absorb leaked breast milk. Leaking of breast milk between feedings is normal.  Use lanolin on your nipples after breastfeeding. Lanolin helps to maintain your skin's normal moisture barrier. If you use pure lanolin, you do not need to wash it off before feeding your baby again. Pure lanolin is not toxic to your baby. You may also hand express a few drops of breast milk and gently massage that milk into your  nipples and allow the milk to air dry. In the first few weeks after giving birth, some women experience extremely full breasts (engorgement). Engorgement can make your breasts feel heavy, warm, and tender to the touch. Engorgement peaks within 3-5 days after you give birth. The following recommendations can help ease engorgement:  Completely empty your breasts while breastfeeding or pumping. You may want to start by applying warm, moist heat (in the shower or with warm water-soaked hand towels) just before feeding or pumping. This increases circulation and helps the milk flow. If your baby does not completely empty your breasts while breastfeeding, pump any extra milk after he or she is finished.  Wear a snug bra (nursing or regular) or tank top for 1-2 days to signal your body to slightly decrease milk production.  Apply ice packs to your breasts, unless this is too uncomfortable for you.  Make sure that your baby is latched on and positioned properly while breastfeeding. If engorgement persists after 48 hours of following these recommendations, contact your health care provider or a lactation consultant. OVERALL HEALTH CARE RECOMMENDATIONS WHILE BREASTFEEDING  Eat healthy foods. Alternate between meals and snacks, eating 3 of each per day. Because what   you eat affects your breast milk, some of the foods may make your baby more irritable than usual. Avoid eating these foods if you are sure that they are negatively affecting your baby.  Drink milk, fruit juice, and water to satisfy your thirst (about 10 glasses a day).  Rest often, relax, and continue to take your prenatal vitamins to prevent fatigue, stress, and anemia.  Continue breast self-awareness checks.  Avoid chewing and smoking tobacco. Chemicals from cigarettes that pass into breast milk and exposure to secondhand smoke may harm your baby.  Avoid alcohol and drug use, including marijuana. Some medicines that may be harmful to your  baby can pass through breast milk. It is important to ask your health care provider before taking any medicine, including all over-the-counter and prescription medicine as well as vitamin and herbal supplements. It is possible to become pregnant while breastfeeding. If birth control is desired, ask your health care provider about options that will be safe for your baby. SEEK MEDICAL CARE IF:  You feel like you want to stop breastfeeding or have become frustrated with breastfeeding.  You have painful breasts or nipples.  Your nipples are cracked or bleeding.  Your breasts are red, tender, or warm.  You have a swollen area on either breast.  You have a fever or chills.  You have nausea or vomiting.  You have drainage other than breast milk from your nipples.  Your breasts do not become full before feedings by the fifth day after you give birth.  You feel sad and depressed.  Your baby is too sleepy to eat well.  Your baby is having trouble sleeping.   Your baby is wetting less than 3 diapers in a 24-hour period.  Your baby has less than 3 stools in a 24-hour period.  Your baby's skin or the white part of his or her eyes becomes yellow.   Your baby is not gaining weight by 5 days of age. SEEK IMMEDIATE MEDICAL CARE IF:  Your baby is overly tired (lethargic) and does not want to wake up and feed.  Your baby develops an unexplained fever.   This information is not intended to replace advice given to you by your health care provider. Make sure you discuss any questions you have with your health care provider.   Document Released: 07/04/2005 Document Revised: 03/25/2015 Document Reviewed: 12/26/2012 Elsevier Interactive Patient Education 2016 [employer]  

## 2016-04-13 NOTE — Progress Notes (Signed)
  Subjective:    Cassandra Foley is a G1P0 369w4d being seen today for her first obstetrical visit.  Her obstetrical history is significant for first pregnancy and obesity. Patient does intend to breast feed. Pregnancy history fully reviewed.  Patient reports no complaints.  Vitals:   04/13/16 1127  BP: 117/74  Pulse: 83  Temp: 98.5 F (36.9 C)  Weight: 240 lb (108.9 kg)    HISTORY: OB History  Gravida Para Term Preterm AB Living  1            SAB TAB Ectopic Multiple Live Births               # Outcome Date GA Lbr Len/2nd Weight Sex Delivery Anes PTL Lv  1 Current              Past Medical History:  Diagnosis Date  . Asthma   . Eczema    History reviewed. No pertinent surgical history. History reviewed. No pertinent family history.   Exam    Uterus:   16-week size uterus  Pelvic Exam:    Perineum: Normal Perineum   Vulva: normal   Vagina:  normal mucosa, normal discharge   pH:    Cervix: nulliparous appearance and closed and long   Adnexa: not evaluated   Bony Pelvis: gynecoid  System: Breast:  normal appearance, no masses or tenderness   Skin: normal coloration and turgor, no rashes    Neurologic: oriented, no focal deficits   Extremities: normal strength, tone, and muscle mass   HEENT extra ocular movement intact   Mouth/Teeth mucous membranes moist, pharynx normal without lesions and dental hygiene good   Neck supple and no masses   Cardiovascular: regular rate and rhythm   Respiratory:  chest clear, no wheezing, crepitations, rhonchi, normal symmetric air entry   Abdomen: soft, non-tender; bowel sounds normal; no masses,  no organomegaly   Urinary:       Assessment:    Pregnancy: G1P0 Patient Active Problem List   Diagnosis Date Noted  . Supervision of normal first pregnancy, antepartum 04/13/2016  . Obesity affecting pregnancy, antepartum 04/13/2016        Plan:     Initial labs drawn. Prenatal vitamins. Problem list reviewed and  updated. Genetic Screening discussed Quad Screen: requested. Will be doen at next visit pending dating  Ultrasound discussed; fetal survey: ordered. Patient declined flu vaccine Patient to return before next appointment for 2 hr glucola and dating ultrasound  Follow up in 4 weeks. 50% of 30 min visit spent on counseling and coordination of care.     Loie Jahr 04/13/2016

## 2016-04-14 MED ORDER — ALBUTEROL SULFATE (2.5 MG/3ML) 0.083% IN NEBU: 2.5000 mg | INHALATION_SOLUTION | Freq: Four times a day (QID) | RESPIRATORY_TRACT | 6 refills | Status: DC | PRN

## 2016-04-14 MED ORDER — ALBUTEROL SULFATE HFA 108 (90 BASE) MCG/ACT IN AERS: 1.0000 | INHALATION_SPRAY | Freq: Four times a day (QID) | RESPIRATORY_TRACT | 1 refills | Status: DC | PRN

## 2016-04-14 MED ORDER — MONTELUKAST SODIUM 10 MG PO TABS: 10.0000 mg | ORAL_TABLET | Freq: Every day | ORAL | 6 refills | Status: DC

## 2016-04-14 NOTE — Addendum Note (Signed)
Addended by: Catalina AntiguaONSTANT, Alianys Chacko on: 04/14/2016 04:29 PM   Modules accepted: Orders

## 2016-04-15 ENCOUNTER — Encounter: Payer: Self-pay | Admitting: Obstetrics and Gynecology

## 2016-04-15 LAB — GC/CHLAMYDIA PROBE AMP
Chlamydia trachomatis, NAA: NEGATIVE
NEISSERIA GONORRHOEAE BY PCR: NEGATIVE

## 2016-04-15 LAB — CULTURE, OB URINE

## 2016-04-15 LAB — URINE CULTURE, OB REFLEX

## 2016-04-19 NOTE — Addendum Note (Signed)
Addended by: Maretta BeesMCGLASHAN, Adar Rase J on: 04/19/2016 03:39 PM   Modules accepted: Orders

## 2016-04-20 ENCOUNTER — Other Ambulatory Visit: Payer: Medicaid Other

## 2016-04-20 DIAGNOSIS — Z3A13 13 weeks gestation of pregnancy: Secondary | ICD-10-CM

## 2016-04-21 LAB — GLUCOSE TOLERANCE, 2 HOURS W/ 1HR
Glucose, 1 hour: 152 mg/dL (ref 65–179)
Glucose, 2 hour: 95 mg/dL (ref 65–152)
Glucose, Fasting: 78 mg/dL (ref 65–91)

## 2016-04-21 LAB — RPR: RPR Ser Ql: NONREACTIVE

## 2016-04-21 LAB — CBC
HEMOGLOBIN: 12.1 g/dL (ref 11.1–15.9)
Hematocrit: 36.3 % (ref 34.0–46.6)
MCH: 31.1 pg (ref 26.6–33.0)
MCHC: 33.3 g/dL (ref 31.5–35.7)
MCV: 93 fL (ref 79–97)
Platelets: 169 10*3/uL (ref 150–379)
RBC: 3.89 x10E6/uL (ref 3.77–5.28)
RDW: 14.9 % (ref 12.3–15.4)
WBC: 7.4 10*3/uL (ref 3.4–10.8)

## 2016-04-21 LAB — HIV ANTIBODY (ROUTINE TESTING W REFLEX): HIV Screen 4th Generation wRfx: NONREACTIVE

## 2016-04-22 LAB — VARICELLA ZOSTER ANTIBODY, IGG: Varicella zoster IgG: 1069 index (ref >165)

## 2016-04-22 LAB — PRENATAL PROFILE I(LABCORP)
ANTIBODY SCREEN: NEGATIVE
Basophils Absolute: 0 10*3/uL (ref 0.0–0.2)
Basos: 1 %
EOS (ABSOLUTE): 0.3 10*3/uL (ref 0.0–0.4)
EOS: 5 %
HEMATOCRIT: 35.6 % (ref 34.0–46.6)
HEP B S AG: NEGATIVE
Hemoglobin: 12.4 g/dL (ref 11.1–15.9)
IMMATURE GRANULOCYTES: 0 %
Immature Grans (Abs): 0 10*3/uL (ref 0.0–0.1)
LYMPHS ABS: 1.7 10*3/uL (ref 0.7–3.1)
Lymphs: 26 %
MCH: 31.1 pg (ref 26.6–33.0)
MCHC: 34.8 g/dL (ref 31.5–35.7)
MCV: 89 fL (ref 79–97)
Monocytes Absolute: 0.4 10*3/uL (ref 0.1–0.9)
Monocytes: 5 %
NEUTROS ABS: 4.1 10*3/uL (ref 1.4–7.0)
NEUTROS PCT: 63 %
Platelets: 152 10*3/uL (ref 150–379)
RBC: 3.99 x10E6/uL (ref 3.77–5.28)
RDW: 14.3 % (ref 12.3–15.4)
RH TYPE: POSITIVE
RPR Ser Ql: NONREACTIVE
RUBELLA: 4.67 {index} (ref >0.99)
WBC: 6.6 10*3/uL (ref 3.4–10.8)

## 2016-04-22 LAB — HEMOGLOBINOPATHY EVALUATION
HEMOGLOBIN A2 QUANTITATION: 2.8 % (ref 0.7–3.1)
HEMOGLOBIN F QUANTITATION: 0.9 % (ref 0.0–2.0)
HGB A: 96.3 % (ref 94.0–98.0)
HGB C: 0 %
HGB S: 0 %

## 2016-04-22 LAB — CYSTIC FIBROSIS MUTATION 97: Interpretation: NOT DETECTED

## 2016-04-22 LAB — TOXASSURE SELECT 13 (MW), URINE

## 2016-04-22 LAB — HIV ANTIBODY (ROUTINE TESTING W REFLEX): HIV SCREEN 4TH GENERATION: NONREACTIVE

## 2016-04-25 ENCOUNTER — Ambulatory Visit (HOSPITAL_COMMUNITY)
Admission: RE | Admit: 2016-04-25 | Discharge: 2016-04-25 | Disposition: A | Discharge status: Home / Self Care | Payer: Medicaid Other | Source: Ambulatory Visit | Attending: Obstetrics and Gynecology | Admitting: Obstetrics and Gynecology

## 2016-04-25 ENCOUNTER — Other Ambulatory Visit: Payer: Self-pay | Admitting: Obstetrics and Gynecology

## 2016-04-25 DIAGNOSIS — Z363 Encounter for antenatal screening for malformations: Secondary | ICD-10-CM | POA: Diagnosis present

## 2016-04-25 DIAGNOSIS — O0932 Supervision of pregnancy with insufficient antenatal care, second trimester: Secondary | ICD-10-CM

## 2016-04-25 DIAGNOSIS — Z3A24 24 weeks gestation of pregnancy: Secondary | ICD-10-CM

## 2016-04-25 DIAGNOSIS — Z3402 Encounter for supervision of normal first pregnancy, second trimester: Secondary | ICD-10-CM

## 2016-04-25 DIAGNOSIS — O99212 Obesity complicating pregnancy, second trimester: Secondary | ICD-10-CM | POA: Diagnosis not present

## 2016-05-18 ENCOUNTER — Ambulatory Visit (INDEPENDENT_AMBULATORY_CARE_PROVIDER_SITE_OTHER): Payer: Medicaid Other | Admitting: Obstetrics and Gynecology

## 2016-05-18 VITALS — BP 117/76 | HR 101 | Temp 98.5°F | Wt 247.4 lb

## 2016-05-18 DIAGNOSIS — O99511 Diseases of the respiratory system complicating pregnancy, first trimester: Secondary | ICD-10-CM | POA: Diagnosis not present

## 2016-05-18 DIAGNOSIS — J45909 Unspecified asthma, uncomplicated: Secondary | ICD-10-CM | POA: Insufficient documentation

## 2016-05-18 DIAGNOSIS — L309 Dermatitis, unspecified: Secondary | ICD-10-CM | POA: Insufficient documentation

## 2016-05-18 DIAGNOSIS — O0930 Supervision of pregnancy with insufficient antenatal care, unspecified trimester: Secondary | ICD-10-CM

## 2016-05-18 DIAGNOSIS — J4520 Mild intermittent asthma, uncomplicated: Secondary | ICD-10-CM

## 2016-05-18 DIAGNOSIS — O0931 Supervision of pregnancy with insufficient antenatal care, first trimester: Secondary | ICD-10-CM | POA: Diagnosis not present

## 2016-05-18 DIAGNOSIS — Z3400 Encounter for supervision of normal first pregnancy, unspecified trimester: Secondary | ICD-10-CM

## 2016-05-18 MED ORDER — ALBUTEROL SULFATE HFA 108 (90 BASE) MCG/ACT IN AERS: 1.0000 | INHALATION_SPRAY | Freq: Four times a day (QID) | RESPIRATORY_TRACT | 1 refills | Status: DC | PRN

## 2016-05-18 NOTE — Progress Notes (Signed)
Patient reports feeling good today, reports good fetal movement.

## 2016-05-18 NOTE — Progress Notes (Signed)
Subjective:  Cassandra Foley is a 20 y.o. G1P0 at 4148w0d being seen today for ongoing prenatal care.  She is currently monitored for the following issues for this low-risk pregnancy and has Supervision of normal first pregnancy, antepartum; Obesity affecting pregnancy, antepartum; Late prenatal care; Asthma; and Eczema on her problem list.  Patient reports no complaints.  Contractions: Not present. Vag. Bleeding: None.  Movement: Present. Denies leaking of fluid.   The following portions of the patient's history were reviewed and updated as appropriate: allergies, current medications, past family history, past medical history, past social history, past surgical history and problem list. Problem list updated.  Objective:   Vitals:   05/18/16 0850  BP: 117/76  Pulse: (!) 101  Temp: 98.5 F (36.9 C)  Weight: 247 lb 6.4 oz (112.2 kg)    Fetal Status: Fetal Heart Rate (bpm): 159   Movement: Present     General:  Alert, oriented and cooperative. Patient is in no acute distress.  Skin: Skin is warm and dry. No rash noted.   Cardiovascular: Normal heart rate noted  Respiratory: Normal respiratory effort, no problems with respiration noted  Abdomen: Soft, gravid, appropriate for gestational age. Pain/Pressure: Absent     Pelvic:  Cervical exam deferred        Extremities: Normal range of motion.  Edema: None  Mental Status: Normal mood and affect. Normal behavior. Normal judgment and thought content.   Urinalysis:      Assessment and Plan:  Pregnancy: G1P0 at 2548w0d  1. Supervision of normal first pregnancy, antepartum Declined Tdap vaccine EDD changed based on 24 wk U/S  2. Late prenatal care   3. Mild intermittent asthma without complication Did not get her MDI, has been using her nebulizer PRN. Instructed to stop nebulizer and use MDI. Rx provided to pt - albuterol (PROVENTIL HFA;VENTOLIN HFA) 108 (90 Base) MCG/ACT inhaler; Inhale 1-2 puffs into the lungs every 6 (six) hours  as needed for wheezing.  Dispense: 1 Inhaler; Refill: 1  Preterm labor symptoms and general obstetric precautions including but not limited to vaginal bleeding, contractions, leaking of fluid and fetal movement were reviewed in detail with the patient. Please refer to After Visit Summary for other counseling recommendations.  Return in about 2 weeks (around 06/01/2016).   Hermina StaggersMichael L Takiesha Mcdevitt, MD

## 2016-06-01 ENCOUNTER — Ambulatory Visit (INDEPENDENT_AMBULATORY_CARE_PROVIDER_SITE_OTHER): Payer: Medicaid Other | Admitting: Certified Nurse Midwife

## 2016-06-01 VITALS — BP 123/72 | HR 89 | Wt 248.0 lb

## 2016-06-01 DIAGNOSIS — Z3403 Encounter for supervision of normal first pregnancy, third trimester: Secondary | ICD-10-CM

## 2016-06-01 DIAGNOSIS — O0932 Supervision of pregnancy with insufficient antenatal care, second trimester: Secondary | ICD-10-CM

## 2016-06-01 DIAGNOSIS — Z3400 Encounter for supervision of normal first pregnancy, unspecified trimester: Secondary | ICD-10-CM

## 2016-06-01 NOTE — Progress Notes (Signed)
  Subjective:    Cassandra Foley is a 20 y.o. female being seen today for her obstetrical visit. She is at 4628w0d gestation. Patient reports no complaints. Fetal movement: normal.  Problem List Items Addressed This Visit      Other   Supervision of normal first pregnancy, antepartum    Other Visit Diagnoses    Supervision of normal first pregnancy in third trimester    -  Primary   Relevant Orders   US MFM OB FOLLOW UP   Limited prenatal care in second trimester       Relevant Orders   US MFM OB FOLLOW UP     Patient Active Problem List   Diagnosis Date Noted  . Late prenatal care 05/18/2016  . Asthma 05/18/2016  . Eczema 05/18/2016  . Supervision of normal first pregnancy, antepartum 04/13/2016  . Obesity affecting pregnancy, antepartum 04/13/2016   Objective:    BP 123/72   Pulse 89   Wt 248 lb (112.5 kg)   LMP 01/16/2016   BMI 42.57 kg/m  FHT:  155 BPM  Uterine Size: 30 cm and size equals dates  Presentation: cephalic     Assessment:    Pregnancy @ 3528w0d weeks   Late to care  Plan:   F/U [place] ordered   labs reviewed, problem list updated Consent signed. GBS planning TDAP offered  Rhogam given for RH negative Pediatrician: discussed. Infant feeding: plans to breastfeed. Maternity leave: discussed. Cigarette smoking: never smoked. Orders Placed This Encounter  Procedures  . US MFM OB FOLLOW UP    Standing Status:   Future    Standing Expiration Date:   08/01/2017    Order Specific Question:   Reason for Exam (SYMPTOM  OR DIAGNOSIS REQUIRED)    Answer:   f/u fetal anatomy scan, growth    Order Specific Question:   Preferred imaging location?    Answer:   [facility]   No orders of the defined types were placed in this encounter.  Follow up in 2 Weeks.

## 2016-06-06 ENCOUNTER — Ambulatory Visit (HOSPITAL_COMMUNITY)
Admission: RE | Admit: 2016-06-06 | Discharge: 2016-06-06 | Disposition: A | Discharge status: Home / Self Care | Payer: Medicaid Other | Source: Ambulatory Visit | Attending: Certified Nurse Midwife | Admitting: Certified Nurse Midwife

## 2016-06-06 DIAGNOSIS — Z3403 Encounter for supervision of normal first pregnancy, third trimester: Secondary | ICD-10-CM

## 2016-06-06 DIAGNOSIS — O99212 Obesity complicating pregnancy, second trimester: Secondary | ICD-10-CM | POA: Diagnosis present

## 2016-06-06 DIAGNOSIS — O99213 Obesity complicating pregnancy, third trimester: Secondary | ICD-10-CM | POA: Insufficient documentation

## 2016-06-06 DIAGNOSIS — Z3A30 30 weeks gestation of pregnancy: Secondary | ICD-10-CM | POA: Diagnosis not present

## 2016-06-06 DIAGNOSIS — O0932 Supervision of pregnancy with insufficient antenatal care, second trimester: Secondary | ICD-10-CM

## 2016-06-06 DIAGNOSIS — Z362 Encounter for other antenatal screening follow-up: Secondary | ICD-10-CM | POA: Insufficient documentation

## 2016-06-20 ENCOUNTER — Other Ambulatory Visit: Payer: Self-pay | Admitting: Certified Nurse Midwife

## 2016-06-20 ENCOUNTER — Ambulatory Visit (INDEPENDENT_AMBULATORY_CARE_PROVIDER_SITE_OTHER): Payer: Medicaid Other | Admitting: Certified Nurse Midwife

## 2016-06-20 VITALS — BP 122/76 | HR 72 | Wt 249.0 lb

## 2016-06-20 DIAGNOSIS — Z3400 Encounter for supervision of normal first pregnancy, unspecified trimester: Secondary | ICD-10-CM

## 2016-06-20 DIAGNOSIS — O26843 Uterine size-date discrepancy, third trimester: Secondary | ICD-10-CM

## 2016-06-20 NOTE — Progress Notes (Signed)
Subjective:    Cassandra Foley is a 20 y.o. female being seen today for her obstetrical visit. She is at 5847w5d gestation. Patient reports no complaints. Fetal movement: normal.  Problem List Items Addressed This Visit    None     Patient Active Problem List   Diagnosis Date Noted  . Late prenatal care 05/18/2016  . Asthma 05/18/2016  . Eczema 05/18/2016  . Supervision of normal first pregnancy, antepartum 04/13/2016  . Obesity affecting pregnancy, antepartum 04/13/2016   Objective:    BP 122/76   Pulse 72   Wt 249 lb (112.9 kg)   LMP 01/16/2016   BMI 42.74 kg/m  FHT:  148 BPM  Uterine Size: 36 cm and size greater than dates  Presentation: cephalic     Assessment:    Pregnancy @ 2047w5d weeks   S>D Plan:    F/u US ordered at [facility]   labs reviewed, problem list updated Consent signed. GBS planning TDAP offered  Rhogam given for RH negative Pediatrician: discussed. Infant feeding: plans to breastfeed. Maternity leave: discussed. Cigarette smoking: never smoked. No orders of the defined types were placed in this encounter.  No orders of the defined types were placed in this encounter.  Follow up in 2 Weeks.

## 2016-07-07 ENCOUNTER — Encounter: Payer: Self-pay | Admitting: Certified Nurse Midwife

## 2016-07-08 ENCOUNTER — Ambulatory Visit (HOSPITAL_COMMUNITY)
Admission: RE | Admit: 2016-07-08 | Discharge: 2016-07-08 | Disposition: A | Discharge status: Home / Self Care | Payer: Medicaid Other | Source: Ambulatory Visit | Attending: Certified Nurse Midwife | Admitting: Certified Nurse Midwife

## 2016-07-08 ENCOUNTER — Encounter (HOSPITAL_COMMUNITY): Payer: Self-pay

## 2016-07-08 ENCOUNTER — Other Ambulatory Visit: Payer: Self-pay | Admitting: Certified Nurse Midwife

## 2016-07-08 DIAGNOSIS — Z3A35 35 weeks gestation of pregnancy: Secondary | ICD-10-CM

## 2016-07-08 DIAGNOSIS — O3663X0 Maternal care for excessive fetal growth, third trimester, not applicable or unspecified: Secondary | ICD-10-CM | POA: Insufficient documentation

## 2016-07-08 DIAGNOSIS — O26843 Uterine size-date discrepancy, third trimester: Secondary | ICD-10-CM

## 2016-07-08 DIAGNOSIS — O99213 Obesity complicating pregnancy, third trimester: Secondary | ICD-10-CM | POA: Diagnosis not present

## 2016-07-12 ENCOUNTER — Other Ambulatory Visit: Payer: Self-pay | Admitting: Certified Nurse Midwife

## 2016-07-12 DIAGNOSIS — Z3403 Encounter for supervision of normal first pregnancy, third trimester: Secondary | ICD-10-CM

## 2016-07-18 NOTE — L&D Delivery Note (Signed)
Delivery Note At 9:06 PM a viable female was delivered via Vaginal, Spontaneous Delivery (Presentation: vertex;ROA).  APGAR: 8,9 ; weight pending  .   Placenta status: spontaneous,intact .  Cord: 3 vessel with the following complications: none.  Cord pH: pending  Anesthesia: Epidural Episiotomy: None Lacerations: None Suture Repair: none Est. Blood Loss (mL):  200cc  Mom to postpartum.  Baby to Couplet care / Skin to Skin.  Beaulah DinningChristina M Foley 07/31/2016, 9:19 PM

## 2016-07-20 ENCOUNTER — Other Ambulatory Visit (HOSPITAL_COMMUNITY)
Admission: RE | Admit: 2016-07-20 | Discharge: 2016-07-20 | Disposition: A | Discharge status: Home / Self Care | Payer: Medicaid Other | Source: Ambulatory Visit | Attending: Certified Nurse Midwife | Admitting: Certified Nurse Midwife

## 2016-07-20 ENCOUNTER — Inpatient Hospital Stay (HOSPITAL_COMMUNITY)
Admission: AD | Admit: 2016-07-20 | Discharge: 2016-07-20 | Disposition: A | Discharge status: Home / Self Care | Payer: 59 | Source: Ambulatory Visit | Attending: Family Medicine | Admitting: Family Medicine

## 2016-07-20 ENCOUNTER — Ambulatory Visit (INDEPENDENT_AMBULATORY_CARE_PROVIDER_SITE_OTHER): Payer: Medicaid Other | Admitting: Certified Nurse Midwife

## 2016-07-20 ENCOUNTER — Encounter (HOSPITAL_COMMUNITY): Payer: Self-pay | Admitting: *Deleted

## 2016-07-20 ENCOUNTER — Encounter: Payer: Self-pay | Admitting: Certified Nurse Midwife

## 2016-07-20 VITALS — BP 127/82 | HR 94 | Temp 98.2°F | Wt 256.0 lb

## 2016-07-20 DIAGNOSIS — Z3689 Encounter for other specified antenatal screening: Secondary | ICD-10-CM

## 2016-07-20 DIAGNOSIS — O368131 Decreased fetal movements, third trimester, fetus 1: Secondary | ICD-10-CM | POA: Diagnosis not present

## 2016-07-20 DIAGNOSIS — O368130 Decreased fetal movements, third trimester, not applicable or unspecified: Secondary | ICD-10-CM | POA: Insufficient documentation

## 2016-07-20 DIAGNOSIS — B373 Candidiasis of vulva and vagina: Secondary | ICD-10-CM | POA: Insufficient documentation

## 2016-07-20 DIAGNOSIS — Z3400 Encounter for supervision of normal first pregnancy, unspecified trimester: Secondary | ICD-10-CM

## 2016-07-20 DIAGNOSIS — Z3A37 37 weeks gestation of pregnancy: Secondary | ICD-10-CM | POA: Insufficient documentation

## 2016-07-20 DIAGNOSIS — J4520 Mild intermittent asthma, uncomplicated: Secondary | ICD-10-CM

## 2016-07-20 DIAGNOSIS — L309 Dermatitis, unspecified: Secondary | ICD-10-CM

## 2016-07-20 DIAGNOSIS — O99210 Obesity complicating pregnancy, unspecified trimester: Secondary | ICD-10-CM

## 2016-07-20 DIAGNOSIS — O0930 Supervision of pregnancy with insufficient antenatal care, unspecified trimester: Secondary | ICD-10-CM

## 2016-07-20 NOTE — Discharge Instructions (Signed)

## 2016-07-20 NOTE — Progress Notes (Signed)
Subjective:    Cassandra Foley is a 21 y.o. female being seen today for her obstetrical visit. She is at 4731w0d gestation. Patient reports no bleeding, no cramping and occasional contractions. Fetal movement: normal.  Problem List Items Addressed This Visit      Respiratory   Asthma     Musculoskeletal and Integument   Eczema     Other   Supervision of normal first pregnancy, antepartum   Relevant Orders   Culture, beta strep (group b only)   Cervicovaginal ancillary only (Completed)   Obesity affecting pregnancy, antepartum - Primary   Late prenatal care     Patient Active Problem List   Diagnosis Date Noted  . Late prenatal care 05/18/2016  . Asthma 05/18/2016  . Eczema 05/18/2016  . Supervision of normal first pregnancy, antepartum 04/13/2016  . Obesity affecting pregnancy, antepartum 04/13/2016    Objective:    BP 127/82   Pulse 94   Temp 98.2 F (36.8 C)   Wt 256 lb (116.1 kg)   LMP 01/16/2016   BMI 43.94 kg/m  FHT: 150 BPM  Uterine Size: 42 cm and size greater than dates  Presentations: cephalic  Pelvic Exam:              Dilation: 1cm       Effacement: 25%             Station:  -3    Consistency: medium            Position: anterior      NST: 1 accels, a few variable decels, minimal variability, Cat. 2 tracing. Contractions on toco about every 8-10 minutes.   Assessment:    Pregnancy @ 5831w0d weeks   Obese  Limited prenatal care in the 3rd trimester Non reactive NST Contractions at term  Plan:    To MAU for evaluation of non-reactive NST  GBS/orange swab today.   Plans for delivery: Vaginal anticipated; labs reviewed; problem list updated Infant feeding: plans to breastfeed. Cigarette smoking: never smoked. L&D discussion: symptoms of labor, discussed when to call, discussed what number to call, anesthetic/analgesic options reviewed and delivering clinician:  plans no preference. Postpartum supports and preparation: circumcision discussed and  contraception plans discussed.  Follow up in 1 Week.

## 2016-07-20 NOTE — MAU Note (Signed)
Non-reactive in office, monitoring for decreased fetal movement.  Denies problems with preg. No pain, though Boykin ReaperRachelle said she was contracting. No bleeding or leaking.

## 2016-07-20 NOTE — Progress Notes (Signed)
Patient states that she had irregular contractions all day Sunday but stopped.

## 2016-07-20 NOTE — MAU Provider Note (Signed)
S:  Cassandra Foley Comes is a 21 y.o. female G1P0 @ 3426w0d here in MAU for NST. She was seen in the office today for her regular scheduled prenatal visit. She reported some decreased fetal movement and NST was documented as non reactive. Since her arrival here she has felt her baby move.    O:  GENERAL: Well-developed, well-nourished female in no acute distress.  LUNGS: Effort normal SKIN: Warm, dry and without erythema PSYCH: Normal mood and affect  Vitals:   07/20/16 1247 07/20/16 1335  BP: 135/75 136/61  Pulse: 79 81  Resp: 16   Temp: 97.9 F (36.6 C)    Fetal Tracing: Baseline: 150 bpm  Variability: moderate  Accelerations: 15x15 Decelerations: quick variables  Toco: none   MDM:  Reactive NST Discussed patient with Dr. Alvester MorinNewton who also reviewed the fetal tracing.   A:  1. NST (non-stress test) reactive     P:  Discharge home in stable condition Labor precautions  Return to MAU If symptoms worsen   Duane LopeJennifer I Rasch, NP 07/20/2016 3:22 PM

## 2016-07-21 LAB — CERVICOVAGINAL ANCILLARY ONLY
BACTERIAL VAGINITIS: NEGATIVE
CANDIDA VAGINITIS: POSITIVE
CHLAMYDIA, DNA PROBE: NEGATIVE
Neisseria Gonorrhea: NEGATIVE
Trichomonas: NEGATIVE

## 2016-07-24 LAB — CULTURE, BETA STREP (GROUP B ONLY): STREP GP B CULTURE: POSITIVE — AB

## 2016-07-25 ENCOUNTER — Other Ambulatory Visit: Payer: Self-pay

## 2016-07-25 ENCOUNTER — Other Ambulatory Visit: Payer: Self-pay | Admitting: Certified Nurse Midwife

## 2016-07-25 DIAGNOSIS — B3731 Acute candidiasis of vulva and vagina: Secondary | ICD-10-CM

## 2016-07-25 DIAGNOSIS — Z3400 Encounter for supervision of normal first pregnancy, unspecified trimester: Secondary | ICD-10-CM

## 2016-07-25 DIAGNOSIS — B373 Candidiasis of vulva and vagina: Secondary | ICD-10-CM

## 2016-07-25 MED ORDER — FLUCONAZOLE 150 MG PO TABS: 150.0000 mg | ORAL_TABLET | Freq: Once | ORAL | 0 refills | Status: AC

## 2016-07-27 ENCOUNTER — Ambulatory Visit (INDEPENDENT_AMBULATORY_CARE_PROVIDER_SITE_OTHER): Payer: Medicaid Other | Admitting: Certified Nurse Midwife

## 2016-07-27 VITALS — BP 115/74 | HR 76 | Temp 97.9°F | Wt 257.9 lb

## 2016-07-27 DIAGNOSIS — J4520 Mild intermittent asthma, uncomplicated: Secondary | ICD-10-CM

## 2016-07-27 DIAGNOSIS — Z3400 Encounter for supervision of normal first pregnancy, unspecified trimester: Secondary | ICD-10-CM

## 2016-07-27 DIAGNOSIS — O99213 Obesity complicating pregnancy, third trimester: Secondary | ICD-10-CM | POA: Diagnosis not present

## 2016-07-27 DIAGNOSIS — E669 Obesity, unspecified: Secondary | ICD-10-CM

## 2016-07-27 DIAGNOSIS — O99210 Obesity complicating pregnancy, unspecified trimester: Secondary | ICD-10-CM

## 2016-07-27 DIAGNOSIS — L309 Dermatitis, unspecified: Secondary | ICD-10-CM

## 2016-07-27 DIAGNOSIS — O99513 Diseases of the respiratory system complicating pregnancy, third trimester: Secondary | ICD-10-CM

## 2016-07-27 MED ORDER — TRIAMCINOLONE ACETONIDE 0.5 % EX OINT: 1.0000 "application " | TOPICAL_OINTMENT | Freq: Two times a day (BID) | CUTANEOUS | 99 refills | Status: DC

## 2016-07-27 NOTE — Progress Notes (Signed)
   PRENATAL VISIT NOTE  Subjective:  Cassandra Foley is a 21 y.o. G1P0 at 5150w0d being seen today for ongoing prenatal care.  She is currently monitored for the following issues for this low-risk pregnancy and has Supervision of normal first pregnancy, antepartum; Obesity affecting pregnancy, antepartum; Late prenatal care; Asthma; and Eczema on her problem list.  Patient reports no complaints.  Contractions: Irregular. Vag. Bleeding: None.  Movement: Present. Denies leaking of fluid.   The following portions of the patient's history were reviewed and updated as appropriate: allergies, current medications, past family history, past medical history, past social history, past surgical history and problem list. Problem list updated.  Objective:   Vitals:   07/27/16 1002  BP: 115/74  Pulse: 76  Temp: 97.9 F (36.6 C)  Weight: 257 lb 14.4 oz (117 kg)    Fetal Status: Fetal Heart Rate (bpm): 152 Fundal Height: 42 cm Movement: Present  Presentation: Vertex  General:  Alert, oriented and cooperative. Patient is in no acute distress.  Skin: Skin is warm and dry. No rash noted.   Cardiovascular: Normal heart rate noted  Respiratory: Normal respiratory effort, no problems with respiration noted  Abdomen: Soft, gravid, appropriate for gestational age. Pain/Pressure: Present     Pelvic:  Cervical exam performed Dilation: 1.5 Effacement (%): 50 Station: -3  Extremities: Normal range of motion.  Edema: Trace  Mental Status: Normal mood and affect. Normal behavior. Normal judgment and thought content.   Assessment and Plan:  Pregnancy: G1P0 at 4850w0d  1. Obesity affecting pregnancy, antepartum      20# weight gain this pregnancy  2. Supervision of normal first pregnancy, antepartum      Doing well. GBS+: PCN in labor      Desires work note to start on her leave, is having swelling/back pain at work  3. Eczema, unspecified type      Cream requested and Sent: Kenalog. Eczema present on  knees and elbows.   4. Mild intermittent asthma without complication     Stable  Term labor symptoms and general obstetric precautions including but not limited to vaginal bleeding, contractions, leaking of fluid and fetal movement were reviewed in detail with the patient. Please refer to After Visit Summary for other counseling recommendations.  No Follow-up on file.   Cassandra Foley, CNM

## 2016-07-31 ENCOUNTER — Inpatient Hospital Stay (HOSPITAL_COMMUNITY)
Admission: AD | Admit: 2016-07-31 | Discharge: 2016-08-02 | DRG: 775 | Disposition: A | Discharge status: Home / Self Care | Payer: Medicaid Other | Source: Ambulatory Visit | Attending: Family Medicine | Admitting: Family Medicine

## 2016-07-31 ENCOUNTER — Inpatient Hospital Stay (HOSPITAL_COMMUNITY): Payer: Medicaid Other | Admitting: Anesthesiology

## 2016-07-31 ENCOUNTER — Encounter (HOSPITAL_COMMUNITY): Payer: Self-pay

## 2016-07-31 DIAGNOSIS — O9952 Diseases of the respiratory system complicating childbirth: Secondary | ICD-10-CM | POA: Diagnosis present

## 2016-07-31 DIAGNOSIS — O99210 Obesity complicating pregnancy, unspecified trimester: Secondary | ICD-10-CM

## 2016-07-31 DIAGNOSIS — O4292 Full-term premature rupture of membranes, unspecified as to length of time between rupture and onset of labor: Principal | ICD-10-CM | POA: Diagnosis present

## 2016-07-31 DIAGNOSIS — Z6841 Body Mass Index (BMI) 40.0 and over, adult: Secondary | ICD-10-CM

## 2016-07-31 DIAGNOSIS — O99824 Streptococcus B carrier state complicating childbirth: Secondary | ICD-10-CM | POA: Diagnosis present

## 2016-07-31 DIAGNOSIS — O99214 Obesity complicating childbirth: Secondary | ICD-10-CM | POA: Diagnosis present

## 2016-07-31 DIAGNOSIS — Z3400 Encounter for supervision of normal first pregnancy, unspecified trimester: Secondary | ICD-10-CM

## 2016-07-31 DIAGNOSIS — Z833 Family history of diabetes mellitus: Secondary | ICD-10-CM | POA: Diagnosis not present

## 2016-07-31 DIAGNOSIS — O4202 Full-term premature rupture of membranes, onset of labor within 24 hours of rupture: Secondary | ICD-10-CM | POA: Diagnosis not present

## 2016-07-31 DIAGNOSIS — J45909 Unspecified asthma, uncomplicated: Secondary | ICD-10-CM | POA: Diagnosis present

## 2016-07-31 DIAGNOSIS — Z3A38 38 weeks gestation of pregnancy: Secondary | ICD-10-CM

## 2016-07-31 LAB — CBC
HCT: 34.9 % — ABNORMAL LOW (ref 36.0–46.0)
Hemoglobin: 12.2 g/dL (ref 12.0–15.0)
MCH: 30.9 pg (ref 26.0–34.0)
MCHC: 35 g/dL (ref 30.0–36.0)
MCV: 88.4 fL (ref 78.0–100.0)
PLATELETS: 160 10*3/uL (ref 150–400)
RBC: 3.95 MIL/uL (ref 3.87–5.11)
RDW: 14.1 % (ref 11.5–15.5)
WBC: 4.9 10*3/uL (ref 4.0–10.5)

## 2016-07-31 LAB — ABO/RH: ABO/RH(D): O POS

## 2016-07-31 LAB — TYPE AND SCREEN
ABO/RH(D): O POS
Antibody Screen: NEGATIVE

## 2016-07-31 LAB — POCT FERN TEST: POCT Fern Test: POSITIVE

## 2016-07-31 LAB — RPR: RPR: NONREACTIVE

## 2016-07-31 MED ORDER — EPHEDRINE 5 MG/ML INJ
10.0000 mg | INTRAVENOUS | Status: DC | PRN
  Filled 2016-07-31: qty 4

## 2016-07-31 MED ORDER — FENTANYL 2.5 MCG/ML BUPIVACAINE 1/10 % EPIDURAL INFUSION (WH - ANES)
14.0000 mL/h | INTRAMUSCULAR | Status: DC | PRN
  Administered 2016-07-31: 14 mL/h via EPIDURAL
  Filled 2016-07-31: qty 100

## 2016-07-31 MED ORDER — LIDOCAINE HCL (PF) 1 % IJ SOLN
30.0000 mL | INTRAMUSCULAR | Status: DC | PRN
  Filled 2016-07-31: qty 30

## 2016-07-31 MED ORDER — ALBUTEROL SULFATE (2.5 MG/3ML) 0.083% IN NEBU: 2.5000 mg | INHALATION_SOLUTION | Freq: Four times a day (QID) | RESPIRATORY_TRACT | Status: DC | PRN

## 2016-07-31 MED ORDER — OXYTOCIN 40 UNITS IN LACTATED RINGERS INFUSION - SIMPLE MED
2.5000 [IU]/h | INTRAVENOUS | Status: DC
  Filled 2016-07-31: qty 1000

## 2016-07-31 MED ORDER — PHENYLEPHRINE 40 MCG/ML (10ML) SYRINGE FOR IV PUSH (FOR BLOOD PRESSURE SUPPORT)
80.0000 ug | PREFILLED_SYRINGE | INTRAVENOUS | Status: DC | PRN
  Filled 2016-07-31: qty 10
  Filled 2016-07-31: qty 5

## 2016-07-31 MED ORDER — OXYTOCIN BOLUS FROM INFUSION
500.0000 mL | Freq: Once | INTRAVENOUS | Status: AC
  Administered 2016-07-31: 500 mL via INTRAVENOUS

## 2016-07-31 MED ORDER — DIPHENHYDRAMINE HCL 50 MG/ML IJ SOLN: 12.5000 mg | INTRAMUSCULAR | Status: DC | PRN

## 2016-07-31 MED ORDER — ACETAMINOPHEN 325 MG PO TABS: 650.0000 mg | ORAL_TABLET | ORAL | Status: DC | PRN

## 2016-07-31 MED ORDER — PENICILLIN G POT IN DEXTROSE 60000 UNIT/ML IV SOLN
3.0000 10*6.[IU] | INTRAVENOUS | Status: DC
  Administered 2016-07-31 (×3): 3 10*6.[IU] via INTRAVENOUS
  Filled 2016-07-31 (×8): qty 50

## 2016-07-31 MED ORDER — OXYCODONE-ACETAMINOPHEN 5-325 MG PO TABS: 2.0000 | ORAL_TABLET | ORAL | Status: DC | PRN

## 2016-07-31 MED ORDER — LACTATED RINGERS IV SOLN: 500.0000 mL | Freq: Once | INTRAVENOUS | Status: DC

## 2016-07-31 MED ORDER — LACTATED RINGERS IV SOLN
INTRAVENOUS | Status: DC
  Administered 2016-07-31 (×2): via INTRAVENOUS

## 2016-07-31 MED ORDER — FLEET ENEMA 7-19 GM/118ML RE ENEM: 1.0000 | ENEMA | RECTAL | Status: DC | PRN

## 2016-07-31 MED ORDER — TRIAMCINOLONE ACETONIDE 0.5 % EX CREA
TOPICAL_CREAM | Freq: Two times a day (BID) | CUTANEOUS | Status: DC
  Filled 2016-07-31: qty 15

## 2016-07-31 MED ORDER — SOD CITRATE-CITRIC ACID 500-334 MG/5ML PO SOLN: 30.0000 mL | ORAL | Status: DC | PRN

## 2016-07-31 MED ORDER — LACTATED RINGERS IV SOLN: 500.0000 mL | INTRAVENOUS | Status: DC | PRN

## 2016-07-31 MED ORDER — PHENYLEPHRINE 40 MCG/ML (10ML) SYRINGE FOR IV PUSH (FOR BLOOD PRESSURE SUPPORT)
80.0000 ug | PREFILLED_SYRINGE | INTRAVENOUS | Status: DC | PRN
  Filled 2016-07-31: qty 5

## 2016-07-31 MED ORDER — ONDANSETRON HCL 4 MG/2ML IJ SOLN
4.0000 mg | Freq: Four times a day (QID) | INTRAMUSCULAR | Status: DC | PRN
  Administered 2016-07-31: 4 mg via INTRAVENOUS
  Filled 2016-07-31: qty 2

## 2016-07-31 MED ORDER — OXYCODONE-ACETAMINOPHEN 5-325 MG PO TABS: 1.0000 | ORAL_TABLET | ORAL | Status: DC | PRN

## 2016-07-31 MED ORDER — TRIAMCINOLONE ACETONIDE 0.5 % EX OINT
1.0000 "application " | TOPICAL_OINTMENT | Freq: Two times a day (BID) | CUTANEOUS | Status: DC
  Filled 2016-07-31: qty 15

## 2016-07-31 MED ORDER — OXYTOCIN 40 UNITS IN LACTATED RINGERS INFUSION - SIMPLE MED
1.0000 m[IU]/min | INTRAVENOUS | Status: DC
  Administered 2016-07-31: 2 m[IU]/min via INTRAVENOUS

## 2016-07-31 MED ORDER — DIPHENHYDRAMINE HCL 25 MG PO CAPS: 25.0000 mg | ORAL_CAPSULE | Freq: Four times a day (QID) | ORAL | Status: DC | PRN

## 2016-07-31 MED ORDER — TERBUTALINE SULFATE 1 MG/ML IJ SOLN
0.2500 mg | Freq: Once | INTRAMUSCULAR | Status: DC | PRN
  Filled 2016-07-31: qty 1

## 2016-07-31 MED ORDER — PENICILLIN G POTASSIUM 5000000 UNITS IJ SOLR
5.0000 10*6.[IU] | Freq: Once | INTRAVENOUS | Status: AC
  Administered 2016-07-31: 5 10*6.[IU] via INTRAVENOUS
  Filled 2016-07-31: qty 5

## 2016-07-31 MED ORDER — LIDOCAINE HCL (PF) 1 % IJ SOLN
INTRAMUSCULAR | Status: DC | PRN
  Administered 2016-07-31: 2 mL
  Administered 2016-07-31: 5 mL
  Administered 2016-07-31: 3 mL

## 2016-07-31 NOTE — Progress Notes (Signed)
Cassandra Foley is a 21 y.o. G1P0 at 5611w4d by ultrasound admitted for rupture of membranes  Subjective:   Objective: BP 108/61   Pulse 78   Temp 98.6 F (37 C) (Oral)   Resp 18   Ht 5\' 3"  (1.6 m)   Wt 247 lb (112 kg)   LMP 01/16/2016   BMI 43.75 kg/m  No intake/output data recorded. No intake/output data recorded.  FHT:  FHR: 140's bpm, variability: moderate,  accelerations:  Present,  decelerations:  Absent UC:   irregular, every 2-7 minutes and mild SVE:   Dilation: 3 Effacement (%): 80 Station: -2 Exam by:: Marlynn Perking. Foley, CNM  Labs: Lab Results  Component Value Date   WBC 4.9 07/31/2016   HGB 12.2 07/31/2016   HCT 34.9 (L) 07/31/2016   MCV 88.4 07/31/2016   PLT 160 07/31/2016    Assessment / Plan: yet to be in labor  Labor: yet to be in labor Preeclampsia:  no signs or symptoms of toxicity and intake and ouput balanced Fetal Wellbeing:  Category I Pain Control:  Labor support without medications I/D:  n/a Anticipated MOD:  NSVD  Cassandra Foley 07/31/2016, 1:42 PM

## 2016-07-31 NOTE — Anesthesia Preprocedure Evaluation (Signed)
Anesthesia Evaluation  Patient identified by MRN, date of birth, ID band Patient awake    Reviewed: Allergy & Precautions, Patient's Chart, lab work & pertinent test results  Airway Mallampati: II       Dental   Pulmonary asthma ,    Pulmonary exam normal        Cardiovascular negative cardio ROS Normal cardiovascular exam     Neuro/Psych negative neurological ROS     GI/Hepatic negative GI ROS, Neg liver ROS,   Endo/Other  Morbid obesity  Renal/GU negative Renal ROS     Musculoskeletal   Abdominal   Peds  Hematology negative hematology ROS (+)   Anesthesia Other Findings   Reproductive/Obstetrics (+) Pregnancy                             Lab Results  Component Value Date   WBC 4.9 07/31/2016   HGB 12.2 07/31/2016   HCT 34.9 (L) 07/31/2016   MCV 88.4 07/31/2016   PLT 160 07/31/2016    Anesthesia Physical Anesthesia Plan  ASA: III  Anesthesia Plan: Epidural   Post-op Pain Management:    Induction:   Airway Management Planned: Natural Airway  Additional Equipment:   Intra-op Plan:   Post-operative Plan:   Informed Consent: I have reviewed the patients History and Physical, chart, labs and discussed the procedure including the risks, benefits and alternatives for the proposed anesthesia with the patient or authorized representative who has indicated his/her understanding and acceptance.     Plan Discussed with:   Anesthesia Plan Comments:         Anesthesia Quick Evaluation

## 2016-07-31 NOTE — H&P (Signed)
LABOR ADMISSION HISTORY AND PHYSICAL  Cassandra Foley is a 21 y.o. female G1P0 with IUP at 6330w4d by U/S at 25 weeks presenting for SROM. She reports +FM, + contractions, + LOF, no VB, no blurry vision, headaches or peripheral edema, and RUQ pain.  She plans on breast feeding. She is undecided what she wants for birth control.  Dating: By U/S --->  Estimated Date of Delivery: 08/10/16  Sono:   @[redacted]w[redacted]d , not CWD by LMP, normal anatomy, cephalic presentation, posterior placenta, 716g, 51% EFW  @[redacted]w[redacted]d , 78% EFW  Prenatal History/Complications: Late PNC (around 23 weeks)  Past Medical History: Past Medical History:  Diagnosis Date  . Asthma   . Eczema     Past Surgical History: Past Surgical History:  Procedure Laterality Date  . NO PAST SURGERIES      Obstetrical History: OB History    Gravida Para Term Preterm AB Living   1             SAB TAB Ectopic Multiple Live Births                  Social History: Social History   Social History  . Marital status: Single    Spouse name: N/A  . Number of children: N/A  . Years of education: N/A   Social History Main Topics  . Smoking status: Never Smoker  . Smokeless tobacco: Never Used  . Alcohol use No  . Drug use: No  . Sexual activity: Not Currently   Other Topics Concern  . None   Social History Narrative  . None    Family History: Family History  Problem Relation Age of Onset  . Asthma Father   . Asthma Brother   . Diabetes Maternal Uncle   . Cancer Maternal Uncle   . Cancer Maternal Grandmother   . Cancer Maternal Grandfather   . Heart disease Neg Hx   . Hypertension Neg Hx   . Hearing loss Neg Hx   . Stroke Neg Hx     Allergies: Allergies  Allergen Reactions  . Peanut-Containing Drug Products Anaphylaxis  . Shellfish Allergy Anaphylaxis  . Tomato Anaphylaxis    Prescriptions Prior to Admission  Medication Sig Dispense Refill Last Dose  . albuterol (ACCUNEB) 1.25 MG/3ML nebulizer solution  Take 1 ampule by nebulization every 6 (six) hours as needed for wheezing or shortness of breath.   07/30/2016 at Unknown time  . diphenhydrAMINE (BENADRYL) 25 mg capsule Take 25 mg by mouth every 6 (six) hours as needed for allergies. For allegies   Past Month at Unknown time  . Prenatal Vit-Fe Fumarate-FA (PRENATAL MULTIVITAMIN) TABS tablet Take 1 tablet by mouth daily at 12 noon.   07/30/2016 at Unknown time  . acetaminophen (TYLENOL) 500 MG tablet Take 1,000 mg by mouth every 6 (six) hours as needed for headache.   More than a month at Unknown time  . albuterol (PROVENTIL HFA;VENTOLIN HFA) 108 (90 Base) MCG/ACT inhaler Inhale 1-2 puffs into the lungs every 6 (six) hours as needed for wheezing. 1 Inhaler 1 Past Week at Unknown time  . EPINEPHrine 0.3 mg/0.3 mL IJ SOAJ injection Inject 0.3 mLs (0.3 mg total) into the muscle once. 1 Device 0 rescue  . triamcinolone ointment (KENALOG) 0.5 % Apply 1 application topically 2 (two) times daily. 90 g PRN      Review of Systems   All systems reviewed and negative except as stated in HPI  BP 138/79 (BP Location: Right Arm)  Pulse 103   Temp 98.1 F (36.7 C) (Oral)   Resp 16   LMP 01/16/2016  General appearance: alert, cooperative and no distress Lungs: clear to auscultation bilaterally Heart: regular rate and rhythm Abdomen: soft, non-tender; bowel sounds normal Extremities: Homans sign is negative, no sign of DVT, edema DTR's normal Presentation: cephalic Fetal monitoringBaseline: 150 bpm, Variability: Good {> 6 bpm), Accelerations: Reactive and Decelerations: Absent Uterine activityFrequency: Every 2-4 minutes   Dilation: 3 Effacement (%): 80 Cervical Position: Anterior Station: -2 Presentation: Vertex Exam by:: benji stanley RN   Prenatal labs: ABO, Rh: O/Positive/-- (09/27 1345) Antibody: Negative (09/27 1345) Rubella: Immune RPR: Non Reactive (10/04 1030)  HBsAg: Negative (09/27 1345)  HIV: Non Reactive (10/04 1030)  GBS:    Positive 1 hr Glucola normal Genetic screening  Too late to test Anatomy US normal  Prenatal Transfer Tool  Maternal Diabetes: No Genetic Screening: Declined Maternal Ultrasounds/Referrals: Normal Fetal Ultrasounds or other Referrals:  None Maternal Substance Abuse:  No Significant Maternal Medications:  None Significant Maternal Lab Results: Lab values include: Group B Strep positive  Results for orders placed or performed during the hospital encounter of 07/31/16 (from the past 24 hour(s))  Fern Test   Collection Time: 07/31/16  3:40 AM  Result Value Ref Range   POCT Fern Test Positive = ruptured amniotic membanes     Patient Active Problem List   Diagnosis Date Noted  . Late prenatal care 05/18/2016  . Asthma 05/18/2016  . Eczema 05/18/2016  . Supervision of normal first pregnancy, antepartum 04/13/2016  . Obesity affecting pregnancy, antepartum 04/13/2016    Assessment: Cassandra Foley is a 21 y.o. G1P0 at [redacted]w[redacted]d here for SROM  #Labor: Expectant management. Patient would like to ambulate around halls. Will hold off on augmentation at this time #Pain: IV pain meds and possibly epidural #FWB: Cat 1 #ID: GBS pos- PCN #MOF: breast #MOC: undecided #Circ:  N/A  Anders Simmonds, MD [facility], PGY-2    OB FELLOW HISTORY AND PHYSICAL ATTESTATION  I have seen and examined this patient; I agree with above documentation in the resident's note.   Patient is becoming more uncomfortable. Continue to monitor expectantly.  Ernestina Penna 07/31/2016, 6:51 AM

## 2016-07-31 NOTE — Progress Notes (Signed)
Cassandra Foley is a 21 y.o. G1P0 at 2030w4d by ultrasound admitted for rupture of membranes  Subjective:   Objective: BP 127/71   Pulse 90   Temp 98.2 F (36.8 C) (Axillary)   Resp 16   Ht 5\' 3"  (1.6 m)   Wt 247 lb (112 kg)   LMP 01/16/2016   BMI 43.75 kg/m  No intake/output data recorded. No intake/output data recorded.  FHT:  FHR: 150 bpm, variability: moderate,  accelerations:  Present,  decelerations:  Absent UC:   rare SVE:   Dilation: 3 Effacement (%): 80 Station: -2 Exam by:: Marlynn Perking. Foley, CNM  Labs: Lab Results  Component Value Date   WBC 4.9 07/31/2016   HGB 12.2 07/31/2016   HCT 34.9 (L) 07/31/2016   MCV 88.4 07/31/2016   PLT 160 07/31/2016    Assessment / Plan: SRON without labor  Labor: yet to be in labor Preeclampsia:  no signs or symptoms of toxicity and intake and ouput balanced Fetal Wellbeing:  Category I Pain Control:  Labor support without medications I/D:  n/a Anticipated MOD:  NSVD  Cassandra Foley 07/31/2016, 9:33 AM

## 2016-07-31 NOTE — Progress Notes (Signed)
Bedside U/S done by Zerita Boersarlene Lawson CNM. Baby is vertex.

## 2016-07-31 NOTE — MAU Note (Signed)
Mucus plug earlier in afternoon then water broke at 0230 am, clear fluid.  Baby moving well. No bleeding. No contractions.  1 cm at last visit.

## 2016-07-31 NOTE — Progress Notes (Signed)
Cassandra Foley is a 21 y.o. G1P0 at 5028w4d by ultrasound admitted for rupture of membranes  Subjective:   Objective: BP 120/77   Pulse 70   Temp 98.5 F (36.9 C) (Oral)   Resp 20   Ht 5\' 3"  (1.6 m)   Wt 247 lb (112 kg)   LMP 01/16/2016   SpO2 99%   BMI 43.75 kg/m  No intake/output data recorded. No intake/output data recorded.  FHT:  FHR: 145-150 bpm, variability: moderate,  accelerations:  Present,  decelerations:  Present variable UC:   regular, every 2-4 minutes SVE:   Dilation: Lip/rim Effacement (%): 100 Station: 0, +1 Exam by:: k fields, rn  Labs: Lab Results  Component Value Date   WBC 4.9 07/31/2016   HGB 12.2 07/31/2016   HCT 34.9 (L) 07/31/2016   MCV 88.4 07/31/2016   PLT 160 07/31/2016    Assessment / Plan: Induction of labor due to PROM,  progressing well on pitocin  Labor: Progressing normally Preeclampsia:  no signs or symptoms of toxicity and intake and ouput balanced Fetal Wellbeing:  Category II Pain Control:  Epidural I/D:  n/a Anticipated MOD:  NSVD  Cassandra Foley 07/31/2016, 7:22 PM

## 2016-07-31 NOTE — Anesthesia Procedure Notes (Signed)
Epidural Patient location during procedure: OB Start time: 07/31/2016 5:32 PM End time: 07/31/2016 5:42 PM  Staffing Anesthesiologist: Marcene DuosFITZGERALD, Grethel Zenk Performed: anesthesiologist   Preanesthetic Checklist Completed: patient identified, site marked, surgical consent, pre-op evaluation, timeout performed, IV checked, risks and benefits discussed and monitors and equipment checked  Epidural Patient position: sitting Prep: site prepped and draped and DuraPrep Patient monitoring: continuous pulse ox and blood pressure Approach: midline Location: L4-L5 Injection technique: LOR air  Needle:  Needle type: Tuohy  Needle gauge: 17 G Needle length: 9 cm and 9 Needle insertion depth: 9 cm Catheter type: closed end flexible Catheter size: 19 Gauge Catheter at skin depth: 17 cm Test dose: negative  Assessment Events: blood not aspirated, injection not painful, no injection resistance, negative IV test and no paresthesia

## 2016-07-31 NOTE — Anesthesia Pain Management Evaluation Note (Signed)
  CRNA Pain Management Visit Note  Patient: Cassandra Foley, 21 y.o., female  "Hello I am a member of the anesthesia team at [facility]. We have an anesthesia team available at all times to provide care throughout the hospital, including epidural management and anesthesia for C-section. I don't know your plan for the delivery whether it a natural birth, water birth, IV sedation, nitrous supplementation, doula or epidural, but we want to meet your pain goals."   1.Was your pain managed to your expectations on prior hospitalizations?   No prior hospitalizations  2.What is your expectation for pain management during this hospitalization?     Epidural  3.How can we help you reach that goal? unsure  Record the patient's initial score and the patient's pain goal.   Pain: 4  Pain Goal: 8 [facility] wants you to be able to say your pain was always managed very well.  Cephus ShellingBURGER,Toney Difatta 07/31/2016

## 2016-08-01 ENCOUNTER — Encounter (HOSPITAL_COMMUNITY): Payer: Self-pay

## 2016-08-01 MED ORDER — WITCH HAZEL-GLYCERIN EX PADS: 1.0000 "application " | MEDICATED_PAD | CUTANEOUS | Status: DC | PRN

## 2016-08-01 MED ORDER — TETANUS-DIPHTH-ACELL PERTUSSIS 5-2.5-18.5 LF-MCG/0.5 IM SUSP: 0.5000 mL | Freq: Once | INTRAMUSCULAR | Status: DC

## 2016-08-01 MED ORDER — ACETAMINOPHEN 325 MG PO TABS: 650.0000 mg | ORAL_TABLET | ORAL | Status: DC | PRN

## 2016-08-01 MED ORDER — ONDANSETRON HCL 4 MG PO TABS: 4.0000 mg | ORAL_TABLET | ORAL | Status: DC | PRN

## 2016-08-01 MED ORDER — SENNOSIDES-DOCUSATE SODIUM 8.6-50 MG PO TABS
2.0000 | ORAL_TABLET | ORAL | Status: DC
  Administered 2016-08-01 – 2016-08-02 (×2): 2 via ORAL
  Filled 2016-08-01 (×2): qty 2

## 2016-08-01 MED ORDER — ONDANSETRON HCL 4 MG/2ML IJ SOLN: 4.0000 mg | INTRAMUSCULAR | Status: DC | PRN

## 2016-08-01 MED ORDER — ZOLPIDEM TARTRATE 5 MG PO TABS: 5.0000 mg | ORAL_TABLET | Freq: Every evening | ORAL | Status: DC | PRN

## 2016-08-01 MED ORDER — DIBUCAINE 1 % RE OINT: 1.0000 "application " | TOPICAL_OINTMENT | RECTAL | Status: DC | PRN

## 2016-08-01 MED ORDER — BENZOCAINE-MENTHOL 20-0.5 % EX AERO: 1.0000 "application " | INHALATION_SPRAY | CUTANEOUS | Status: DC | PRN

## 2016-08-01 MED ORDER — PRENATAL MULTIVITAMIN CH
1.0000 | ORAL_TABLET | Freq: Every day | ORAL | Status: DC
  Administered 2016-08-01: 1 via ORAL
  Filled 2016-08-01: qty 1

## 2016-08-01 MED ORDER — SIMETHICONE 80 MG PO CHEW: 80.0000 mg | CHEWABLE_TABLET | ORAL | Status: DC | PRN

## 2016-08-01 MED ORDER — DIPHENHYDRAMINE HCL 25 MG PO CAPS
25.0000 mg | ORAL_CAPSULE | Freq: Four times a day (QID) | ORAL | Status: DC | PRN
  Administered 2016-08-01: 25 mg via ORAL
  Filled 2016-08-01: qty 1

## 2016-08-01 MED ORDER — COCONUT OIL OIL: 1.0000 "application " | TOPICAL_OIL | Status: DC | PRN

## 2016-08-01 MED ORDER — IBUPROFEN 600 MG PO TABS
600.0000 mg | ORAL_TABLET | Freq: Four times a day (QID) | ORAL | Status: DC
  Administered 2016-08-01 – 2016-08-02 (×6): 600 mg via ORAL
  Filled 2016-08-01 (×6): qty 1

## 2016-08-01 NOTE — Lactation Note (Signed)
This note was copied from a baby's chart. Lactation Consultation Note Mom waiting to go to BR to void. Had been having difficulty voiding after delivery.  Mom stated that she BF once, then tried again and baby wasn't hungry. Mom has tubular soft breast w/compressible everted nipples. Mom states that Lt. Nipple is usually flat. When LC assessed, nipple everted, areola drew up stimulated as well. Very compressible. Skin to nipple and areola tougher than breast tissue.  LC will have to re-visit mom w/teaching and assistance at another time. Left LC brochure and reviewed resources and OP services.  Patient Name: Cassandra Foley ZOXWR'UToday's Date: 08/01/2016 Reason for consult: Initial assessment   Maternal Data Has patient been taught Hand Expression?: Yes Does the patient have breastfeeding experience prior to this delivery?: No  Feeding Feeding Type: Breast Fed Length of feed: 15 min  LATCH Score/Interventions Latch: Repeated attempts needed to sustain latch, nipple held in mouth throughout feeding, stimulation needed to elicit sucking reflex. Intervention(s): Adjust position;Assist with latch;Breast massage  Audible Swallowing: A few with stimulation Intervention(s): Skin to skin;Hand expression  Type of Nipple: Everted at rest and after stimulation  Comfort (Breast/Nipple): Soft / non-tender     Hold (Positioning): Assistance needed to correctly position infant at breast and maintain latch. Intervention(s): Support Pillows;Breastfeeding basics reviewed;Position options;Skin to skin  LATCH Score: 7  Lactation Tools Discussed/Used WIC Program: Yes   Consult Status Consult Status: Follow-up Date: 08/01/16 Follow-up type: In-patient    Makinze Jani, Diamond NickelLAURA G 08/01/2016, 4:04 AM

## 2016-08-01 NOTE — Progress Notes (Signed)
Post Partum Day 1 Subjective: no complaints, up ad lib, voiding and tolerating PO  Objective: Blood pressure 128/60, pulse 77, temperature 98.3 F (36.8 C), temperature source Oral, resp. rate 18, height 5\' 3"  (1.6 m), weight 247 lb (112 kg), last menstrual period 01/16/2016, SpO2 100 %, unknown if currently breastfeeding.  Physical Exam:  General: alert, cooperative, appears stated age and no distress Lochia: appropriate Uterine Fundus: firm Incision: n/a DVT Evaluation: No evidence of DVT seen on physical exam.   Recent Labs  07/31/16 0430  HGB 12.2  HCT 34.9*    Assessment/Plan: Plan for discharge tomorrow   LOS: 1 day   Cassandra Foley 08/01/2016, 5:18 AM

## 2016-08-01 NOTE — Plan of Care (Signed)
Problem: Education: Goal: Knowledge of condition will improve Outcome: Progressing Discussed breastfeeding and breast care.

## 2016-08-01 NOTE — Progress Notes (Signed)
UR chart review completed.  

## 2016-08-01 NOTE — Lactation Note (Signed)
This note was copied from a baby's chart. Lactation Consultation Note Attempted visit at 19 hours of age.  Mom reports feedings going well.  Mom reports she needs to feed the baby soon and baby is starting to wake up.  Lc asked if visitors needed to step out so I could assist with breastfeeding.  Mom declined and asked for assist later.  [facility].   Patient Name: Cassandra Foley ZOXWR'UToday's Date: 08/01/2016     Maternal Data    Feeding Feeding Type: Breast Fed Length of feed: 25 min  LATCH Score/Interventions                      Lactation Tools Discussed/Used     Consult Status      [name], [name] 08/01/2016, 4:07 PM

## 2016-08-01 NOTE — Anesthesia Postprocedure Evaluation (Signed)
Anesthesia Post Note  Patient: Cassandra Foley  Procedure(s) Performed: * No procedures listed *  Patient location during evaluation: Mother Baby Anesthesia Type: Epidural Level of consciousness: awake, awake and alert, oriented and patient cooperative Pain management: pain level controlled Vital Signs Assessment: post-procedure vital signs reviewed and stable Respiratory status: spontaneous breathing, nonlabored ventilation and respiratory function stable Cardiovascular status: stable Postop Assessment: no headache, no backache, patient able to bend at knees and no signs of nausea or vomiting Anesthetic complications: no        Last Vitals:  Vitals:   08/01/16 0045 08/01/16 0445  BP: 130/69 128/60  Pulse: 69 77  Resp: 20 18  Temp: 36.7 C 36.8 C    Last Pain:  Vitals:   08/01/16 0629  TempSrc:   PainSc: 0-No pain   Pain Goal:                 Zhyon Antenucci L

## 2016-08-02 MED ORDER — IBUPROFEN 600 MG PO TABS: 600.0000 mg | ORAL_TABLET | Freq: Four times a day (QID) | ORAL | 0 refills | Status: AC

## 2016-08-02 NOTE — Discharge Summary (Signed)
Obstetric Discharge Summary Reason for Admission: onset of labor Prenatal Procedures: ultrasound Intrapartum Procedures: spontaneous vaginal delivery Postpartum Procedures: none Complications-Operative and Postpartum: none Hemoglobin  Date Value Ref Range Status  07/31/2016 12.2 12.0 - 15.0 g/dL Final   HCT  Date Value Ref Range Status  07/31/2016 34.9 (L) 36.0 - 46.0 % Final   Hematocrit  Date Value Ref Range Status  04/20/2016 36.3 34.0 - 46.6 % Final    Physical Exam:  General: alert, cooperative and no distress Lochia: appropriate Uterine Fundus: firm Incision: n/a DVT Evaluation: No evidence of DVT seen on physical exam.  Discharge Diagnoses: Term Pregnancy-delivered  Discharge Information: Date: 08/02/2016 Activity: unrestricted and pelvic rest Diet: routine Medications: PNV and Ibuprofen Condition: stable Instructions: refer to practice specific booklet Discharge to: home  Contraception: IUD [facility] Information    [facility] in 5 week(s).   Contact information: [address] [place] [phone]          Newborn Data: Live born female  Birth Weight: 7 lb 7 oz (3374 g) APGAR: 8, 9  Home with mother.  Cassandra StanleyLisa Foley 08/02/2016, 9:12 AM

## 2016-08-02 NOTE — Lactation Note (Signed)
This note was copied from a baby's chart. Lactation Consultation Note  Patient Name: Cassandra Foley ZOXWR'UToday's Date: 08/02/2016 Reason for consult: Follow-up assessment   With this mom and term baby, now 1737 hours old. I assisted mom with cross cradle latch, and baby latched quickly and easily, deeply, with strong, rhythmic suckles and great breast movement. Basic breast feeding and lactation teaching don with mom, and she knows to call for questions/concern to lactation as needed.    Maternal Data    Feeding Feeding Type: Breast Fed Length of feed: 20 min  LATCH Score/Interventions Latch: Grasps breast easily, tongue down, lips flanged, rhythmical sucking. Intervention(s): Assist with latch  Audible Swallowing: A few with stimulation  Type of Nipple: Everted at rest and after stimulation (semi flat)  Comfort (Breast/Nipple): Soft / non-tender     Hold (Positioning): Assistance needed to correctly position infant at breast and maintain latch. Intervention(s): Breastfeeding basics reviewed;Support Pillows;Position options;Skin to skin  LATCH Score: 8  Lactation Tools Discussed/Used Pump Review: Setup, frequency, and cleaning;Milk Storage;Other (comment) (hand expression reviewed, lactation o/p services reviewed) Date initiated:: 08/02/16   Consult Status Consult Status: Complete Follow-up type: Call as needed    Alfred LevinsLee, Jes Costales Anne 08/02/2016, 10:21 AM

## 2016-08-02 NOTE — Discharge Instructions (Signed)

## 2016-08-03 ENCOUNTER — Encounter: Payer: Self-pay | Admitting: Obstetrics and Gynecology

## 2016-08-05 ENCOUNTER — Ambulatory Visit (HOSPITAL_COMMUNITY): Payer: Medicaid Other

## 2016-08-12 ENCOUNTER — Ambulatory Visit (HOSPITAL_COMMUNITY): Payer: Medicaid Other

## 2016-09-01 ENCOUNTER — Ambulatory Visit (INDEPENDENT_AMBULATORY_CARE_PROVIDER_SITE_OTHER): Payer: 59 | Admitting: Obstetrics

## 2016-09-01 ENCOUNTER — Encounter: Payer: Self-pay | Admitting: Obstetrics

## 2016-09-01 DIAGNOSIS — Z1389 Encounter for screening for other disorder: Secondary | ICD-10-CM | POA: Diagnosis not present

## 2016-09-01 DIAGNOSIS — Z3009 Encounter for other general counseling and advice on contraception: Secondary | ICD-10-CM

## 2016-09-01 DIAGNOSIS — F53 Postpartum depression: Secondary | ICD-10-CM

## 2016-09-01 DIAGNOSIS — O99345 Other mental disorders complicating the puerperium: Secondary | ICD-10-CM

## 2016-09-01 NOTE — Progress Notes (Signed)
Post Partum Exam  Cassandra Foley is a 21 y.o. [id number] female who presents for a postpartum visit. She is 4 weeks postpartum following a spontaneous vaginal delivery. I have fully reviewed the prenatal and intrapartum course. The delivery was at 40 gestational weeks.  Anesthesia: epidural. Postpartum course has been normal. Baby's course has been normal. Baby is feeding by both breast and bottle - Similac Advance. Bleeding no bleeding. Bowel function is normal. Bladder function is normal. Patient is not sexually active. Contraception method is abstinence. Postpartum depression screening:  positive  The following portions of the patient's history were reviewed and updated as appropriate: allergies, current medications, past family history, past medical history, past social history, past surgical history and problem list.  Review of Systems A comprehensive review of systems was negative.   Except for postpartum depression.  Objective:  unknown if currently breastfeeding.  General:  alert and no distress   Breasts:  inspection negative, no nipple discharge or bleeding, no masses or nodularity palpable  Lungs: clear to auscultation bilaterally  Heart:  regular rate and rhythm, S1, S2 normal, no murmur, click, rub or gallop  Abdomen: soft, non-tender; bowel sounds normal; no masses,  no organomegaly   Vulva:  normal  Vagina: normal vagina  Cervix:  no cervical motion tenderness  Corpus: normal size, contour, position, consistency, mobility, non-tender  Adnexa:  no mass, fullness, tenderness  Rectal Exam: Not performed.        Assessment:    Normal postpartum exam. Pap smear not done at today's visit.   Contraceptive Counseling and Advice.  Wants IUD.  Postpartum Depression  Plan:   1. Contraception: IUD 2. Patient given educational brochures for Mirena and ParaGuard 3. Referred to [facility] for counseling 4. Follow up in: 6 weeks.  IUD insertion.

## 2016-09-26 ENCOUNTER — Ambulatory Visit: Payer: Self-pay | Admitting: Obstetrics

## 2016-09-26 NOTE — Progress Notes (Deleted)
Last Intercourse Date?

## 2017-06-25 ENCOUNTER — Other Ambulatory Visit: Payer: Self-pay | Admitting: Obstetrics and Gynecology

## 2017-06-25 DIAGNOSIS — J4520 Mild intermittent asthma, uncomplicated: Secondary | ICD-10-CM

## 2017-07-11 ENCOUNTER — Encounter (HOSPITAL_COMMUNITY): Payer: Self-pay | Admitting: Emergency Medicine

## 2017-07-11 ENCOUNTER — Other Ambulatory Visit: Payer: Self-pay

## 2017-07-11 ENCOUNTER — Emergency Department (HOSPITAL_COMMUNITY): Payer: 59

## 2017-07-11 ENCOUNTER — Emergency Department (HOSPITAL_COMMUNITY)
Admission: EM | Admit: 2017-07-11 | Discharge: 2017-07-11 | Disposition: A | Discharge status: Home / Self Care | Payer: 59 | Attending: Emergency Medicine | Admitting: Emergency Medicine

## 2017-07-11 DIAGNOSIS — R062 Wheezing: Secondary | ICD-10-CM | POA: Diagnosis present

## 2017-07-11 DIAGNOSIS — Z91010 Allergy to peanuts: Secondary | ICD-10-CM | POA: Insufficient documentation

## 2017-07-11 DIAGNOSIS — J45901 Unspecified asthma with (acute) exacerbation: Secondary | ICD-10-CM | POA: Diagnosis not present

## 2017-07-11 DIAGNOSIS — J4520 Mild intermittent asthma, uncomplicated: Secondary | ICD-10-CM

## 2017-07-11 DIAGNOSIS — Z79899 Other long term (current) drug therapy: Secondary | ICD-10-CM | POA: Insufficient documentation

## 2017-07-11 LAB — POC URINE PREG, ED: PREG TEST UR: NEGATIVE

## 2017-07-11 MED ORDER — ALBUTEROL SULFATE HFA 108 (90 BASE) MCG/ACT IN AERS
2.0000 | INHALATION_SPRAY | Freq: Once | RESPIRATORY_TRACT | Status: AC
  Administered 2017-07-11: 2 via RESPIRATORY_TRACT
  Filled 2017-07-11: qty 6.7

## 2017-07-11 MED ORDER — ALBUTEROL SULFATE HFA 108 (90 BASE) MCG/ACT IN AERS: 1.0000 | INHALATION_SPRAY | Freq: Four times a day (QID) | RESPIRATORY_TRACT | 2 refills | Status: DC | PRN

## 2017-07-11 MED ORDER — ACETAMINOPHEN 325 MG PO TABS
650.0000 mg | ORAL_TABLET | Freq: Once | ORAL | Status: AC | PRN
  Administered 2017-07-11: 650 mg via ORAL
  Filled 2017-07-11: qty 2

## 2017-07-11 MED ORDER — PREDNISONE 20 MG PO TABS
60.0000 mg | ORAL_TABLET | Freq: Once | ORAL | Status: AC
  Administered 2017-07-11: 60 mg via ORAL
  Filled 2017-07-11: qty 3

## 2017-07-11 MED ORDER — ALBUTEROL SULFATE (2.5 MG/3ML) 0.083% IN NEBU
5.0000 mg | INHALATION_SOLUTION | Freq: Once | RESPIRATORY_TRACT | Status: AC
  Administered 2017-07-11: 5 mg via RESPIRATORY_TRACT
  Filled 2017-07-11: qty 6

## 2017-07-11 NOTE — ED Provider Notes (Signed)
[facility] EMERGENCY DEPARTMENT Provider Note   CSN: [id number] Arrival date & time: 07/11/17  1732     History   Chief Complaint Chief Complaint  Patient presents with  . Wheezing  . Headache  . Cough    HPI Cassandra Foley is a 21 y.o. female.  The history is provided by the patient.  Wheezing   This is a recurrent problem. The current episode started more than 1 week ago. Episode frequency: Intermittently. The problem has been resolved. Associated symptoms include headaches. Pertinent negatives include no chest pain, no fever, no vomiting, no diarrhea, no dysuria, no rhinorrhea, no sore throat, no neck pain, no cough, no sputum production and no rash. It is unknown what precipitated the problem. Treatments tried: nebulizer treatment here. The treatment provided significant relief. She has had prior ED visits. Her past medical history is significant for asthma (no formal diagnosis).    Past Medical History:  Diagnosis Date  . Asthma    last used inhaler yesterday  . Eczema     Patient Active Problem List   Diagnosis Date Noted  . NSVD (normal spontaneous vaginal delivery) 08/02/2016  . Normal labor 07/31/2016  . Late prenatal care 05/18/2016  . Asthma 05/18/2016  . Eczema 05/18/2016  . Supervision of normal first pregnancy, antepartum 04/13/2016  . Obesity affecting pregnancy, antepartum 04/13/2016    Past Surgical History:  Procedure Laterality Date  . NO PAST SURGERIES      OB History    Gravida Para Term Preterm AB Living   1 1 1     1    SAB TAB Ectopic Multiple Live Births         0 1       Home Medications    Prior to Admission medications   Medication Sig Start Date End Date Taking? Authorizing Provider  acetaminophen (TYLENOL) 500 MG tablet Take 500 mg by mouth every 6 (six) hours as needed for mild pain, moderate pain or headache.     [provider]  albuterol (PROVENTIL HFA;VENTOLIN HFA) 108 (90 Base) MCG/ACT  inhaler Inhale 1-2 puffs into the lungs every 6 (six) hours as needed for wheezing. 07/11/17   Forest Becker, MD  diphenhydrAMINE (BENADRYL) 25 mg capsule Take 25 mg by mouth every 6 (six) hours as needed for allergies.     [provider]  EPINEPHrine (EPIPEN 2-PAK) 0.3 mg/0.3 mL IJ SOAJ injection Inject 0.3 mg into the muscle once as needed (for severe allergic reaction).    [provider]  ibuprofen (ADVIL,MOTRIN) 600 MG tablet Take 1 tablet (600 mg total) by mouth every 6 (six) hours. 08/02/16   Leftwich-Kirby, Wilmer Floor, CNM  Prenatal MV-Min-FA-Omega-3 (PRENATAL GUMMIES/DHA & FA) 0.4-32.5 MG CHEW Chew 2 each by mouth at bedtime.    [provider]    Family History Family History  Problem Relation Age of Onset  . Asthma Father   . Asthma Brother   . Diabetes Maternal Uncle   . Cancer Maternal Uncle   . Cancer Maternal Grandmother   . Cancer Maternal Grandfather   . Heart disease Neg Hx   . Hypertension Neg Hx   . Hearing loss Neg Hx   . Stroke Neg Hx     Social History Social History   Tobacco Use  . Smoking status: Never Smoker  . Smokeless tobacco: Never Used  Substance Use Topics  . Alcohol use: No  . Drug use: No     Allergies  Peanut-containing drug products; Shellfish allergy; and Tomato   Review of Systems Review of Systems  Constitutional: Negative for chills and fever.  HENT: Negative for rhinorrhea and sore throat.   Eyes: Negative for visual disturbance.  Respiratory: Positive for wheezing. Negative for cough and sputum production.   Cardiovascular: Negative for chest pain.  Gastrointestinal: Negative for diarrhea and vomiting.  Genitourinary: Negative for dysuria.  Musculoskeletal: Negative for neck pain.  Skin: Negative for rash.  Allergic/Immunologic: Negative for immunocompromised state.  Neurological: Positive for headaches. Negative for light-headedness.  Psychiatric/Behavioral: Negative for confusion.      Physical Exam Updated Vital Signs BP 130/87 (BP Location: Right Arm)   Pulse 70   Temp 98.3 F (36.8 C) (Oral)   Resp 18   Ht 5\' 3"  (1.6 m)   Wt 99.8 kg (220 lb)   LMP 06/13/2017 (LMP Unknown)   SpO2 98%   BMI 38.97 kg/m   Physical Exam  Constitutional: She is oriented to person, place, and time. She appears well-developed and well-nourished. No distress.  HENT:  Head: Normocephalic and atraumatic.  Eyes: Conjunctivae are normal.  Neck: Neck supple.  Cardiovascular: Normal rate and regular rhythm.  No murmur heard. Pulmonary/Chest: Effort normal and breath sounds normal. No respiratory distress. She has no wheezes.  Abdominal: Soft. There is no tenderness.  Musculoskeletal: Normal range of motion. She exhibits no edema.  Neurological: She is alert and oriented to person, place, and time.  Skin: Skin is warm and dry.  Psychiatric: She has a normal mood and affect. Her behavior is normal.  Nursing note and vitals reviewed.    ED Treatments / Results  Labs (all labs ordered are listed, but only abnormal results are displayed) Labs Reviewed  POC URINE PREG, ED    EKG  EKG Interpretation None       Radiology Dg Chest 2 View  Result Date: 07/11/2017 CLINICAL DATA:  21 year old female with wheezing and shortness of breath. EXAM: CHEST  2 VIEW COMPARISON:  Chest radiograph dated 08/13/2012 FINDINGS: The heart size and mediastinal contours are within normal limits. Both lungs are clear. The visualized skeletal structures are unremarkable. IMPRESSION: No active cardiopulmonary disease. Electronically Signed   By: Elgie CollardArash  Radparvar M.D.   On: 07/11/2017 18:23    Procedures Procedures (including critical care time)  Medications Ordered in ED Medications  acetaminophen (TYLENOL) tablet 650 mg (650 mg Oral Given 07/11/17 1756)  albuterol (PROVENTIL) (2.5 MG/3ML) 0.083% nebulizer solution 5 mg (5 mg Nebulization Given 07/11/17 1756)  albuterol (PROVENTIL  HFA;VENTOLIN HFA) 108 (90 Base) MCG/ACT inhaler 2 puff (2 puffs Inhalation Given 07/11/17 2113)  predniSONE (DELTASONE) tablet 60 mg (60 mg Oral Given 07/11/17 2113)     Initial Impression / Assessment and Plan / ED Course  I have reviewed the triage vital signs and the nursing notes.  Pertinent labs & imaging results that were available during my care of the patient were reviewed by me and considered in my medical decision making (see chart for details).     Pt with h/o asthma presents with wheezing. Says over the last week she's had increased wheezing and when her wheezing has been severe, she's developed a mild, generalized HA. Has never been formally diagnoses w/asthma, but has had wheezing in the past that has responded to nebulizer treatments in the past & used to carry an albuterol inhaler. Denies F/C, lightheadedness, CP, SOB, N/V/D, urinary symptoms, recent illness.  VS & exam as above. CXR WNL. Pt given nebulizer treatment  and Tylenol prior to my evaluation, and is now asymptomatic. Will give dose of prednisone and albuterol MDI in the ED.  Explained all results to the Pt. Will discharge the Pt home with rx for albuterol MDI. Recommending follow-up with PCP. ED return precautions provided. Pt acknowledged understanding of, and concurrence with the plan. All questions answered to her satisfaction. In stable condition at the time of discharge.  Final Clinical Impressions(s) / ED Diagnoses   Final diagnoses:  Wheezing    ED Discharge Orders        Ordered    albuterol (PROVENTIL HFA;VENTOLIN HFA) 108 (90 Base) MCG/ACT inhaler  Every 6 hours PRN     07/11/17 2110       Forest BeckerPetit, Adalaya Irion, MD 07/11/17 2320    Charlynne PanderYao, David Hsienta, MD 07/13/17 [phone]

## 2017-07-11 NOTE — ED Triage Notes (Signed)
Pt c/o cough, wheezing, sinus problems, and headache x 1 week.

## 2017-10-19 ENCOUNTER — Encounter (HOSPITAL_COMMUNITY): Payer: Self-pay | Admitting: Emergency Medicine

## 2017-10-19 ENCOUNTER — Other Ambulatory Visit: Payer: Self-pay

## 2017-10-19 DIAGNOSIS — Z79899 Other long term (current) drug therapy: Secondary | ICD-10-CM | POA: Diagnosis not present

## 2017-10-19 DIAGNOSIS — J4521 Mild intermittent asthma with (acute) exacerbation: Secondary | ICD-10-CM | POA: Insufficient documentation

## 2017-10-19 DIAGNOSIS — J45909 Unspecified asthma, uncomplicated: Secondary | ICD-10-CM | POA: Diagnosis present

## 2017-10-19 DIAGNOSIS — Z91010 Allergy to peanuts: Secondary | ICD-10-CM | POA: Diagnosis not present

## 2017-10-19 MED ORDER — ALBUTEROL SULFATE (2.5 MG/3ML) 0.083% IN NEBU
5.0000 mg | INHALATION_SOLUTION | Freq: Once | RESPIRATORY_TRACT | Status: AC
  Administered 2017-10-19: 5 mg via RESPIRATORY_TRACT
  Filled 2017-10-19: qty 6

## 2017-10-20 ENCOUNTER — Emergency Department (HOSPITAL_COMMUNITY)
Admission: EM | Admit: 2017-10-20 | Discharge: 2017-10-20 | Disposition: A | Discharge status: Home / Self Care | Payer: 59 | Attending: Emergency Medicine | Admitting: Emergency Medicine

## 2017-10-20 DIAGNOSIS — J4521 Mild intermittent asthma with (acute) exacerbation: Secondary | ICD-10-CM

## 2017-10-20 MED ORDER — PREDNISONE 10 MG (21) PO TBPK: ORAL_TABLET | Freq: Every day | ORAL | 0 refills | Status: DC

## 2017-10-20 MED ORDER — AEROCHAMBER PLUS FLO-VU LARGE MISC
1.0000 | Freq: Once | Status: AC
  Administered 2017-10-20: 1

## 2017-10-20 MED ORDER — ALBUTEROL SULFATE HFA 108 (90 BASE) MCG/ACT IN AERS
2.0000 | INHALATION_SPRAY | Freq: Once | RESPIRATORY_TRACT | Status: AC
  Administered 2017-10-20: 2 via RESPIRATORY_TRACT
  Filled 2017-10-20: qty 6.7

## 2017-10-20 NOTE — ED Provider Notes (Signed)
[facility] EMERGENCY [facility]PART[facility] Provider Note   CSN: [id number] Arrival date & time: 10/19/17  2308     History   Chief Complaint Chief Complaint  Patient presents with  . Asthma    HPI Cassandra Foley is a 22 y.o. female with a history of asthma and eczema tends to the emergency department with a chief complaint of constant, gradually worsening dyspnea with associated wheezing, increased work of breathing, and nonproductive cough that is worse at night that began 4 days ago.  Dyspnea is worse with exertion and alleviated with rest.  She denies URI sx, myalgia, chest pain, back pain, fever, chills, palpitations, dizziness, lightheadedness, nausea, vomiting, or diarrhea.  She reports that she treated her symptoms earlier today with the last couple of puffs and her albuterol inhaler with improvement for a short period of time.  She reports that she has no other inhalers at home and needs to follow-up with her primary care provider.  She called earlier today to schedule follow-up appointment, but cannot be seen until the end of next month.  She states that the current symptoms feel just like when her asthma flares up.  She reports that in the past that her asthma flares with seasonal changes.  She has a history of seasonal allergies.  She also reports that she has 1 smoker who lives in the home.  No history of ICU admissions or intubation related to her asthma.  The history is provided by the patient. No language interpreter was used.  Asthma  Associated symptoms include shortness of breath. Pertinent negatives include no chest pain, no abdominal pain and no headaches.    Past Medical History:  Diagnosis Date  . Asthma    last used inhaler yesterday  . Eczema     Patient Active Problem List   Diagnosis Date Noted  . NSVD (normal spontaneous vaginal delivery) 08/02/2016  . Normal labor 07/31/2016  . Late prenatal care 05/18/2016  . Asthma 05/18/2016  . Eczema  05/18/2016  . Supervision of normal first pregnancy, antepartum 04/13/2016  . Obesity affecting pregnancy, antepartum 04/13/2016    Past Surgical History:  Procedure Laterality Date  . NO PAST SURGERIES       OB History    Gravida  1   Para  1   Term  1   Preterm      AB      Living  1     SAB      TAB      Ectopic      Multiple  0   Live Births  1            Home Medications    Prior to Admission medications   Medication Sig Start Date End Date Taking? Authorizing Provider  acetaminophen (TYLENOL) 500 MG tablet Take 500 mg by mouth every 6 (six) hours as needed for mild pain, moderate pain or headache.     [provider]  albuterol (PROVENTIL HFA;VENTOLIN HFA) 108 (90 Base) MCG/ACT inhaler Inhale 1-2 puffs into the lungs every 6 (six) hours as needed for wheezing. 07/11/17   Forest BeckerPetit, Nicholas, MD  diphenhydrAMINE (BENADRYL) 25 mg capsule Take 25 mg by mouth every 6 (six) hours as needed for allergies.     [provider]  EPINEPHrine (EPIPEN 2-PAK) 0.3 mg/0.3 mL IJ SOAJ injection Inject 0.3 mg into the muscle once as needed (for severe allergic reaction).    [provider]  ibuprofen (ADVIL,MOTRIN) 600  MG tablet Take 1 tablet (600 mg total) by mouth every 6 (six) hours. 08/02/16   Leftwich-Kirby, Wilmer Floor, CNM  predniSONE (STERAPRED UNI-PAK 21 TAB) 10 MG (21) TBPK tablet Take by mouth daily. Take 6 tabs by mouth daily  for 2 days, then 5 tabs for 2 days, then 4 tabs for 2 days, then 3 tabs for 2 days, 2 tabs for 2 days, then 1 tab by mouth daily for 2 days 10/20/17   Ellawyn Wogan A, PA-C  Prenatal MV-Min-FA-Omega-3 (PRENATAL GUMMIES/DHA & FA) 0.4-32.5 MG CHEW Chew 2 each by mouth at bedtime.    [provider]    Family History Family History  Problem Relation Age of Onset  . Asthma Father   . Asthma Brother   . Diabetes Maternal Uncle   . Cancer Maternal Uncle   . Cancer Maternal Grandmother   . Cancer Maternal Grandfather    . Heart disease Neg Hx   . Hypertension Neg Hx   . Hearing loss Neg Hx   . Stroke Neg Hx     Social History Social History   Tobacco Use  . Smoking status: Never Smoker  . Smokeless tobacco: Never Used  Substance Use Topics  . Alcohol use: No  . Drug use: No     Allergies   Peanut-containing drug products; Shellfish allergy; and Tomato   Review of Systems Review of Systems  Constitutional: Negative for activity change, chills and fever.  HENT: Negative for congestion and sore throat.   Respiratory: Positive for cough, shortness of breath and wheezing.   Cardiovascular: Negative for chest pain, palpitations and leg swelling.  Gastrointestinal: Negative for abdominal pain, diarrhea, nausea and vomiting.  Musculoskeletal: Negative for back pain.  Skin: Negative for rash.  Allergic/Immunologic: Negative for immunocompromised state.  Neurological: Negative for dizziness, syncope, weakness, light-headedness and headaches.     Physical Exam Updated Vital Signs BP 125/67 (BP Location: Right Arm)   Pulse 62   Temp 98.3 F (36.8 C) (Oral)   Resp 18   Ht 5\' 3"  (1.6 m)   Wt 99.8 kg (220 lb)   SpO2 100%   BMI 38.97 kg/m   Physical Exam  Constitutional: No distress.  Obese female  HENT:  Head: Normocephalic.  Eyes: Conjunctivae are normal.  Neck: Neck supple.  Cardiovascular: Normal rate, regular rhythm, normal heart sounds and intact distal pulses. Exam reveals no gallop and no friction rub.  No murmur heard. Pulmonary/Chest: Effort normal. No stridor. No respiratory distress. She has wheezes. She has no rales. She exhibits no tenderness.  End expiratory wheezes in the bilateral bases.  Lungs are otherwise clear to auscultation bilaterally.  No nasal flaring, accessory muscle use, or retractions.  Abdominal: Soft. She exhibits no distension.  Musculoskeletal: She exhibits no edema or tenderness.  Neurological: She is alert.  Skin: Skin is warm. Capillary refill  takes less than 2 seconds. No rash noted. She is not diaphoretic. No erythema. No pallor.  Psychiatric: Her behavior is normal.  Nursing note and vitals reviewed.    ED Treatments / Results  Labs (all labs ordered are listed, but only abnormal results are displayed) Labs Reviewed - No data to display  EKG None  Radiology No results found.  Procedures Procedures (including critical care time)  Medications Ordered in ED Medications  albuterol (PROVENTIL HFA;VENTOLIN HFA) 108 (90 Base) MCG/ACT inhaler 2 puff (has no administration in time range)  AEROCHAMBER PLUS FLO-VU LARGE MISC 1 each (has no administration in time  range)  albuterol (PROVENTIL) (2.5 MG/3ML) 0.083% nebulizer solution 5 mg (5 mg Nebulization Given 10/19/17 2326)     Initial Impression / Assessment and Plan / ED Course  I have reviewed the triage vital signs and the nursing notes.  Pertinent labs & imaging results that were available during my care of the patient were reviewed by me and considered in my medical decision making (see chart for details).     22 year old female with a history of eczema and asthma presenting with dyspnea, increased work of breathing, nonproductive cough, and wheezing that has gradually worsened over the last 4 days.  No constitutional symptoms.  On exam after 1 nebulizer treatment, and expiratory wheezes are noted in the bilateral bases.  Lungs are otherwise clear to auscultation bilaterally.  SaO2 100% on room air at rest.  She was ambulated by me through the department and maintained an SaO2 of 96-98% on room air.  The patient reports that she feels as if she is breathing at her baseline and is much improved from arrival.  Given risk factors for exacerbation, including a smoker in the home and seasonal change, will discharge the patient with a longer prednisone taper since she is unable to follow-up with her primary care provider until the end of next month.  She has been given a new  albuterol inhaler and spacer in the emergency department.  Recommended over-the-counter Flonase and Claritin for seasonal allergies.  She is hemodynamically stable and in no acute distress.  Strict return precautions given.  The patient is safe for discharge home at this time.  Final Clinical Impressions(s) / ED Diagnoses   Final diagnoses:  Mild intermittent asthma with exacerbation    ED Discharge Orders        Ordered    predniSONE (STERAPRED UNI-PAK 21 TAB) 10 MG (21) TBPK tablet  Daily     10/20/17 0050       Logon Uttech, Pedro Earls A, PA-C 10/20/17 0111    Ward, Layla Maw, DO 10/20/17 3086

## 2017-10-20 NOTE — Discharge Instructions (Signed)
Take 2 puffs of your albuterol inhaler with the spacer every 4 hours as needed for shortness of breath.  To simulate a breathing treatment, you can take 2 puffs of the albuterol inhaler every 20 minutes for 3 doses for a total of 6 puffs and 60 minutes. Make sure you not repeat this dosing for 4 hours, but it can simulate a breathing treatment if you do not have one at home. Side effects of this dosing can include increased heart rate or feeling jittery.  For the prednisone: take 6 tabs by mouth daily  for 2 days, then 5 tabs for 2 days, then 4 tabs for 2 days, then 3 tabs for 2 days, 2 tabs for 2 days, then 1 tab by mouth daily for 2 days.  Please make sure to complete this entire medication and do not stay taking it even if your symptoms start to feel better before the course of medication is complete.  To help control your seasonal allergies Flonase is a nose spray that is available over-the-counter.  You can also use an antihistamine such as Claritin, Zyrtec or the generic version of the medications. These medications are also available over the counter.   If you develop any new or worsening symptoms including feeling as if your throat is closing, worsening shortness of breath despite using her inhaler, chest pain with your shortness of breath, or high fever with a cough and back pain, please return to the emergency department for re-evaluation.

## 2018-01-29 ENCOUNTER — Encounter: Payer: Self-pay | Admitting: Obstetrics

## 2018-08-13 IMAGING — US US MFM OB COMP +14 WKS
1 series · 14 of 28 positions shown · non-contrast
Comparison: none

[Series 1: us mfm ob comp +14 wks · 82 acquisitions, 14 frames shown]
[im 4/82]
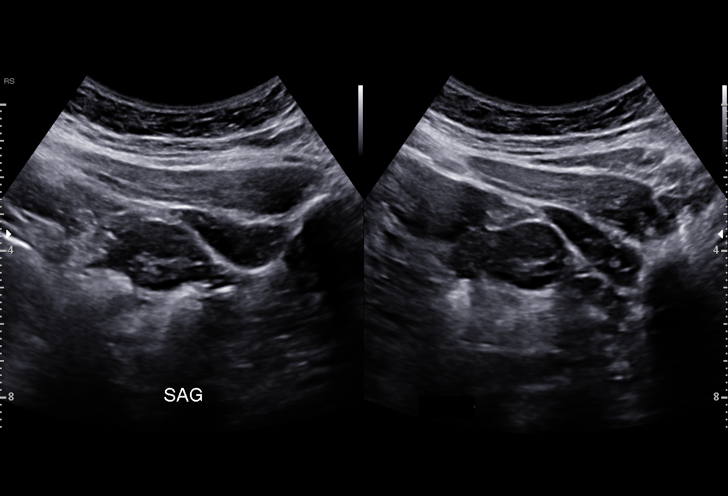
[im 10/82]
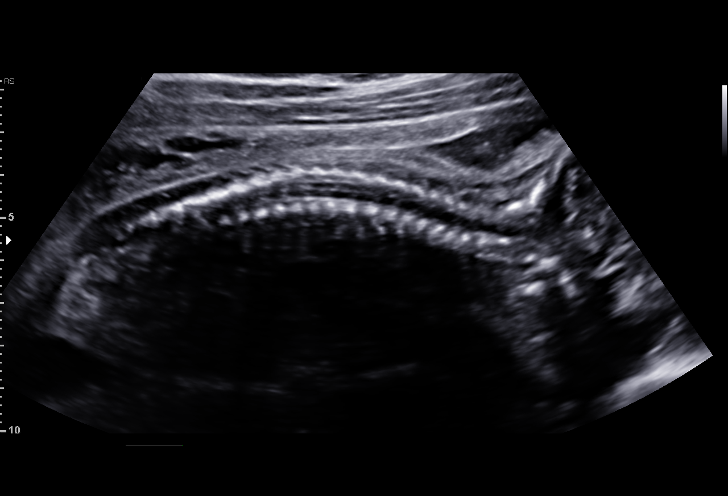
[im 16/82]
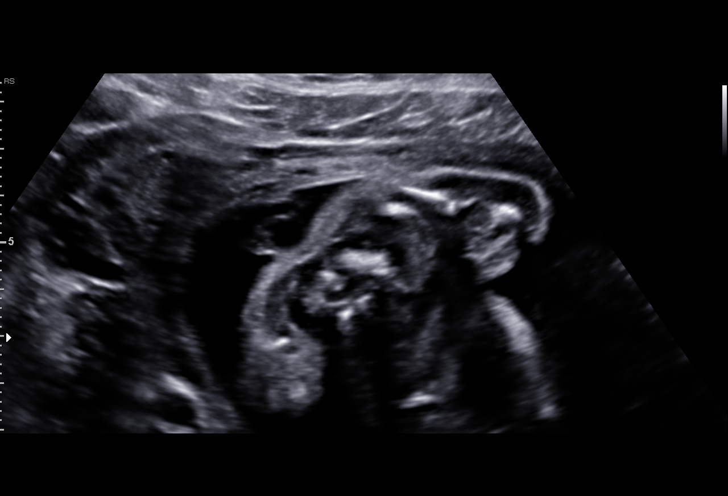
[im 22/82]
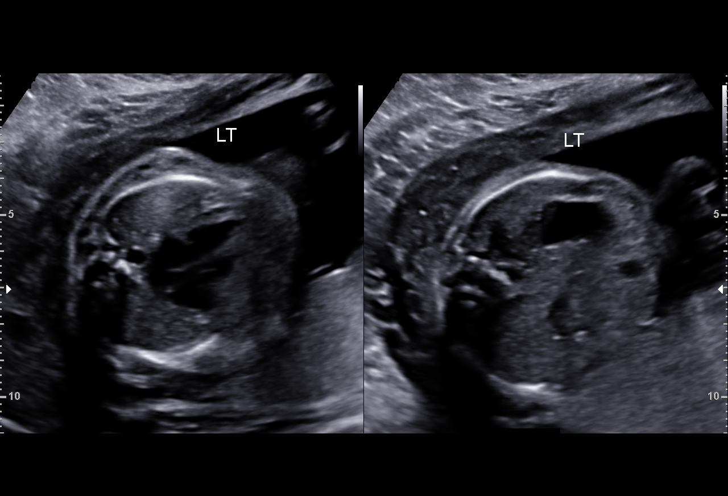
[im 28/82]
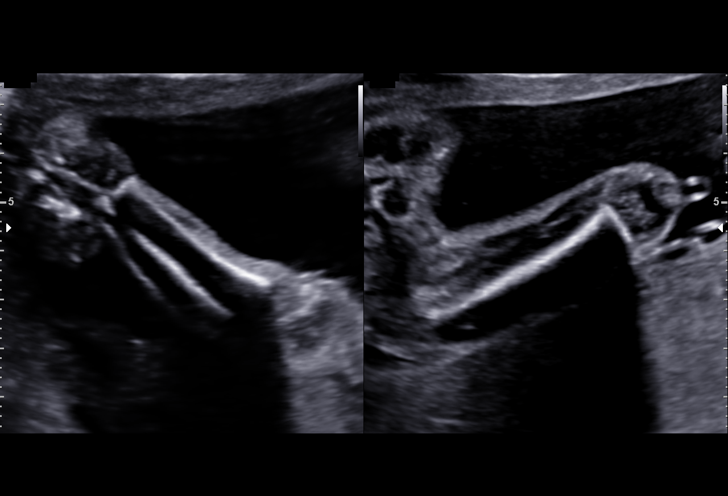
[im 34/82]
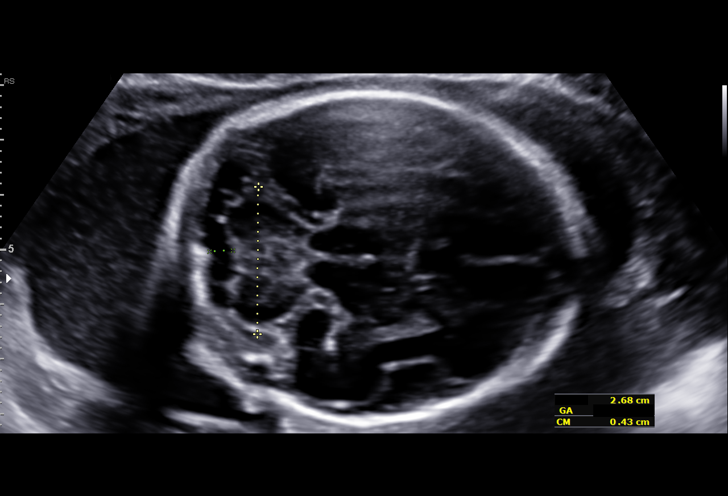
[im 40/82]
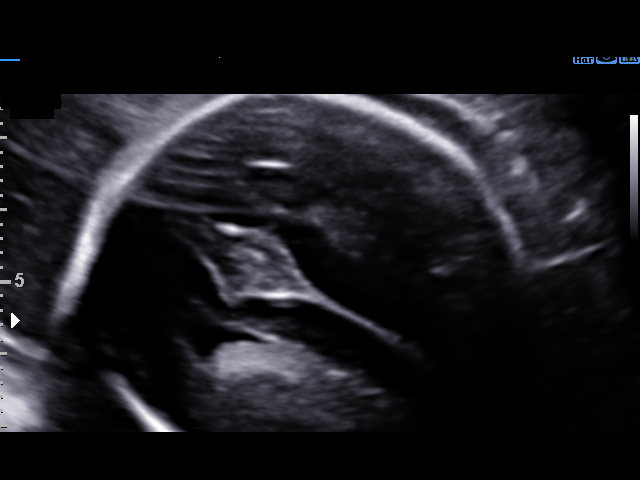
[im 46/82]
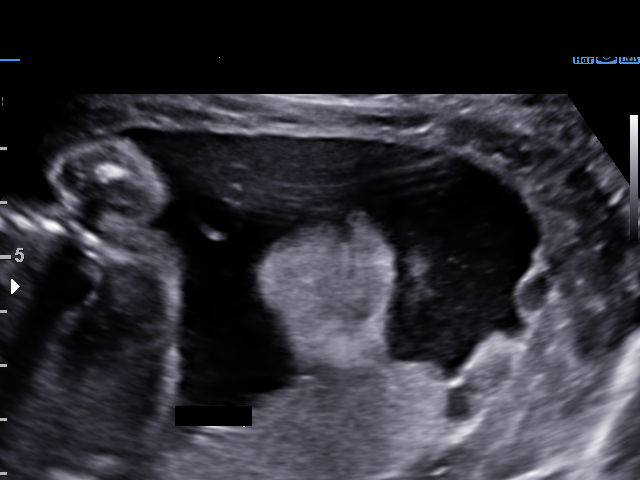
[im 52/82]
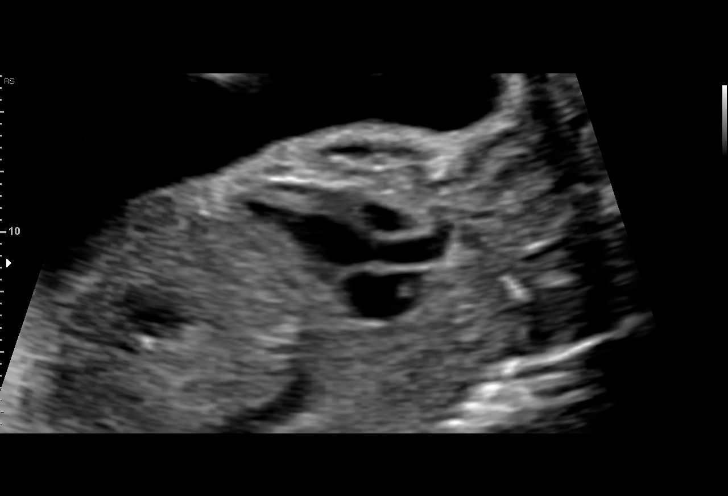
[im 58/82]
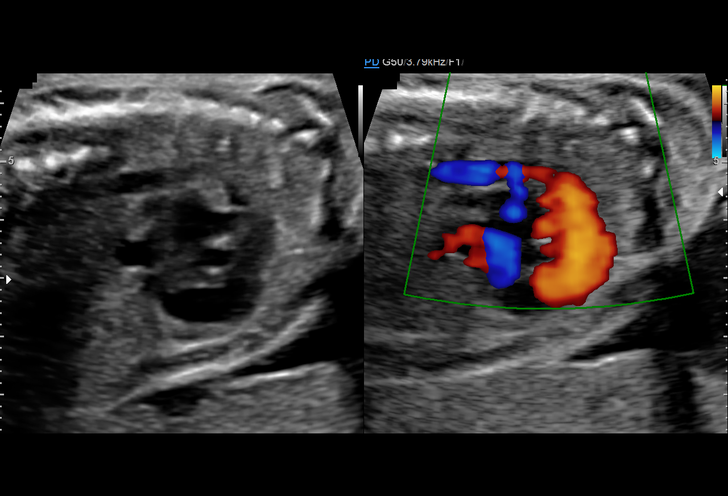
[im 64/82]
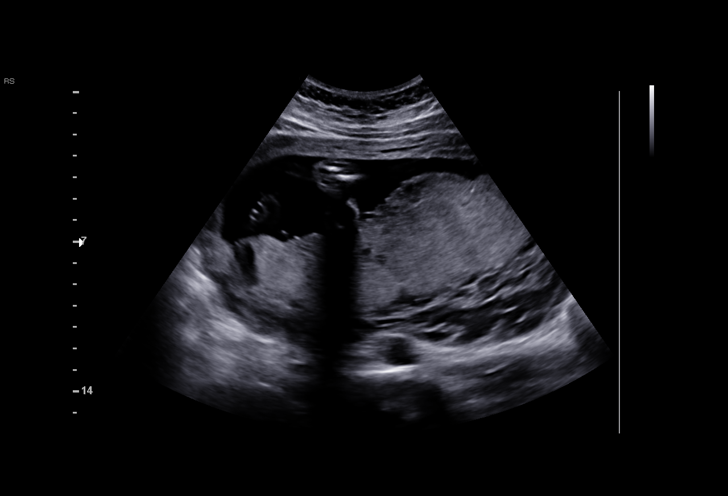
[im 70/82]
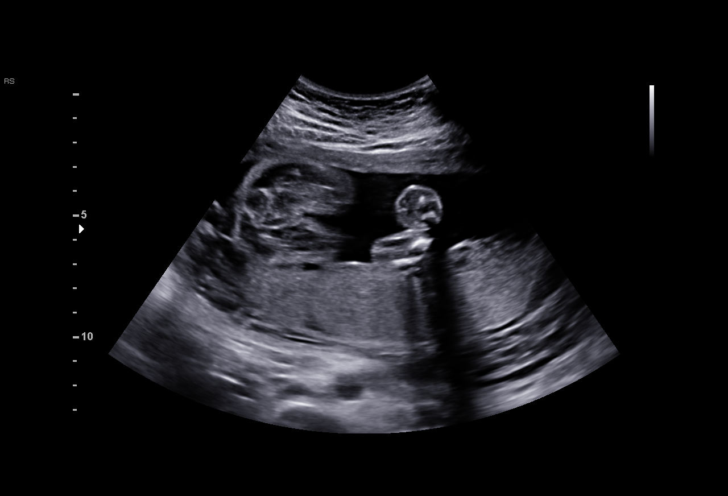
[im 76/82]
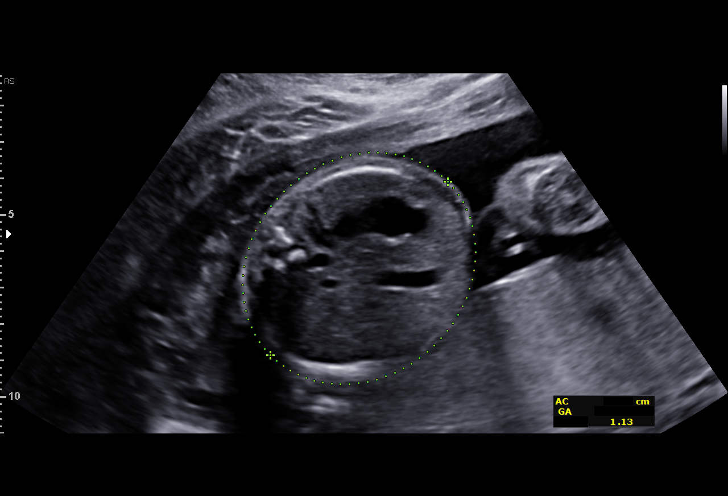
[im 82/82]
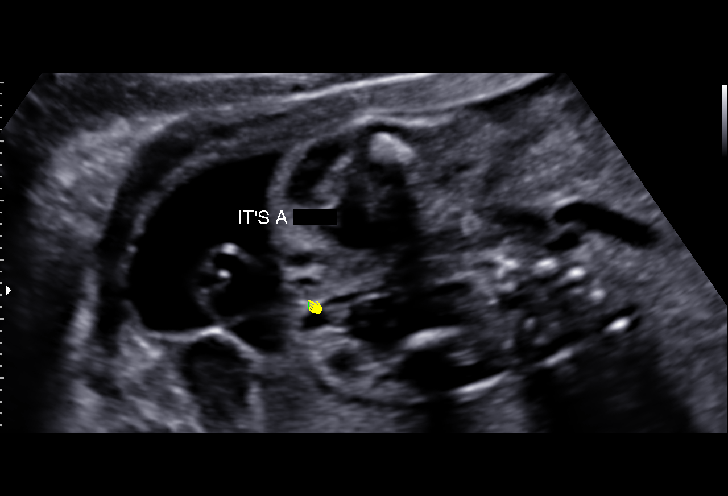

[14 of 28 positions shown; findings below may reference images not displayed]

1  SANJA RODRIGUE           [id number]      [id number]     [id number]
Indications

24 weeks gestation of pregnancy
Encounter for antenatal screening for
malformations
Late prenatal care, second trimester
Obesity complicating pregnancy, second
trimester
OB History

Gravidity:    1
Fetal Evaluation

Num Of Fetuses:     1
Fetal Heart         168
Rate(bpm):
Cardiac Activity:   Observed
Presentation:       Cephalic
Placenta:           Posterior/ Left lateralabove cervical os
P. Cord Insertion:  Visualized

Amniotic Fluid
AFI FV:      Subjectively within normal limits

Largest Pocket(cm)
5.81
Biometry

BPD:        60  mm     G. Age:  24w 4d         33  %    CI:        69.73   %   70 - 86
FL/HC:      19.5   %   18.7 -
HC:      229.3  mm     G. Age:  25w 0d         39  %    HC/AC:      1.16       1.04 -
AC:      197.9  mm     G. Age:  24w 3d         33  %    FL/BPD:     74.7   %   71 - 87
FL:       44.8  mm     G. Age:  24w 6d         39  %    FL/AC:      22.6   %   20 - 24
HUM:      39.9  mm     G. Age:  24w 2d         33  %
CER:      26.8  mm     G. Age:  24w 3d         40  %

CM:        4.3  mm
Est. FW:     716  gm      1 lb 9 oz     51  %
Gestational Age

LMP:           14w 2d       Date:   01/16/16                 EDD:   10/22/16
U/S Today:     24w 5d                                        EDD:   08/10/16
Best:          24w 5d    Det. By:   U/S (04/25/16)           EDD:   08/10/16
Anatomy

Cranium:               Appears normal         Aortic Arch:            Appears normal
Cavum:                 Appears normal         Ductal Arch:            Not well visualized
Ventricles:            Appears normal         Diaphragm:              Appears normal
Choroid Plexus:        Appears normal         Stomach:                Appears normal, left
sided
Cerebellum:            Appears normal         Abdomen:                Appears normal
Posterior Fossa:       Appears normal         Abdominal Wall:         Appears nml (cord
insert, abd wall)
Nuchal Fold:           Not applicable (>20    Cord Vessels:           Appears normal (3
wks GA)                                        vessel cord)
Face:                  Profile nl; orbits not Kidneys:                Appear normal
well visualized
Lips:                  Appears normal         Bladder:                Appears normal
Thoracic:              Appears normal         Spine:                  Appears normal
Heart:                 Appears normal         Upper Extremities:      Appears normal
(4CH, axis, and
situs)
RVOT:                  Not well visualized    Lower Extremities:      Appears normal
LVOT:                  Appears normal

Other:  Parents do not wish to know sex of fetus. Technically difficult due to
fetal position.
Cervix Uterus Adnexa

Cervix
Length:           3.72  cm.
Normal appearance by transabdominal scan.

Uterus
No abnormality visualized.

Left Ovary
Within normal limits.

Right Ovary
Not visualized.

Adnexa:       No abnormality visualized. No adnexal mass
visualized.
Impression

SIUP at 24+5 weeks
Normal detailed fetal anatomy; limited views of orbits, RVOT
and DA
Normal amniotic fluid volume
EDC based on today's measurements: 08/10/16
Recommendations

Follow-up ultrasound in 4-6 weeks to complete anatomy
survey

## 2019-05-22 ENCOUNTER — Telehealth: Payer: 59 | Admitting: Family

## 2019-05-22 DIAGNOSIS — J4520 Mild intermittent asthma, uncomplicated: Secondary | ICD-10-CM | POA: Diagnosis not present

## 2019-05-22 MED ORDER — PREDNISONE 20 MG PO TABS: 40.0000 mg | ORAL_TABLET | Freq: Every day | ORAL | 0 refills | Status: AC

## 2019-05-22 MED ORDER — ALBUTEROL SULFATE HFA 108 (90 BASE) MCG/ACT IN AERS: 2.0000 | INHALATION_SPRAY | Freq: Four times a day (QID) | RESPIRATORY_TRACT | 0 refills | Status: DC | PRN

## 2019-05-22 NOTE — Progress Notes (Signed)
E Visit for Asthma  Based on what you have shared with me, it looks like you may have a flare up of your asthma.  Asthma is a chronic (ongoing) lung disease which results in airway obstruction, inflammation and hyper-responsiveness.   Asthma symptoms vary from person to person, with common symptoms including nighttime awakening and decreased ability to participate in normal activities as a result of shortness of breath. It is often triggered by changes in weather, changes in the season, changes in air temperature, or inside (home, school, daycare or work) allergens such as animal dander, mold, mildew, woodstoves or cockroaches.   It can also be triggered by hormonal changes, extreme emotion, physical exertion or an upper respiratory tract illness.     It is important to identify the trigger, and then eliminate or avoid the trigger if possible.   If you have been prescribed medications to be taken on a regular basis, it is important to follow the asthma action plan and to follow guidelines to adjust medication in response to increasing symptoms of decreased peak expiratory flow rate  Treatment: I have prescribed: Albuterol (Proventil HFA; Ventolin HFA) 108 (90 Base) MCG/ACT Inhaler 2 puffs into the lungs every six hours as needed for wheezing or shortness of breath and Prednisone 40mg  by mouth per day for 5 days.  You may also think about getting COVID tested. You can go to one of the testing sites listed below, while they are opened (see hours). You do not need an order and will stay in your car during the test. You do need to self isolate until your results return and if positive 10 days from when your symptoms started and until you are 3 days fever free.   Testing Locations (Monday - Friday, 8 a.m. - 3:30 p.m.) . [place]: [facility] at [facility], [address] [place], [place]: [place], [place], [place], [place] (entrance [address])  . [facility]: (Closed each Monday): Testing site relocated to the short stay covered drive at [facility]. (Use the [employer] entrance to [facility] next to [place] . Only take medications as instructed by your medical team. . Consider wearing a mask or scarf to improve breathing air temperature have been shown to decrease irritation and decrease exacerbations . Get rest. . Taking a steamy shower or using a humidifier may help nasal congestion sand ease sore throat pain. You can place a towel over your head and breathe in the steam from hot water coming from a faucet. . Using a saline nasal spray works much the same way.  . Cough drops, hare candies and sore throat lozenges may ease your cough.  . Avoid close contacts especially the very you and the elderly . Cover your mouth if you cough or sneeze . Always remember to wash your hands.    GET HELP RIGHT AWAY IF: . You develop worsening symptoms; breathlessness at rest, drowsy, confused or agitated, unable to speak in full sentences . You have coughing fits . You develop a severe headache or visual changes . You develop shortness of breath, difficulty breathing or start having chest pain . Your symptoms persist after you have completed your treatment plan . If your symptoms do not improve within 10 days  MAKE SURE YOU . Understand these instructions. . Will watch your condition. . Will get help right away if you are  not doing well or get worse.   Your e-visit answers were reviewed by a board certified advanced clinical practitioner to complete your personal care plan, Depending upon the condition, your plan could have included both over the counter or prescription medications.  Please review your pharmacy choice. Your safety is important to [place]. If you have  drug allergies check your prescription carefully. You can use MyChart to ask questions about today's visit, request a non-urgent call back, or ask for a work or school excuse for 24 hours related to this e-Visit. If it has been greater than 24 hours you will need to follow up with your provider, or enter a new e-Visit to address those concerns.  You will get an e-mail in the next two days asking about your experience. I hope that your e-visit has been valuable and will speed your recovery. Thank you for using e-visits.   Approximately 5 minutes was spent documenting and reviewing patient's chart.

## 2019-06-03 ENCOUNTER — Ambulatory Visit (INDEPENDENT_AMBULATORY_CARE_PROVIDER_SITE_OTHER): Payer: 59 | Admitting: Family Medicine

## 2019-06-03 ENCOUNTER — Other Ambulatory Visit: Payer: Self-pay

## 2019-06-03 ENCOUNTER — Encounter: Payer: Self-pay | Admitting: Family Medicine

## 2019-06-03 VITALS — BP 124/84 | HR 67 | Temp 97.6°F | Resp 12 | Ht 63.5 in | Wt 280.0 lb

## 2019-06-03 DIAGNOSIS — R3915 Urgency of urination: Secondary | ICD-10-CM

## 2019-06-03 DIAGNOSIS — J45909 Unspecified asthma, uncomplicated: Secondary | ICD-10-CM | POA: Diagnosis not present

## 2019-06-03 LAB — POC MICROSCOPIC URINALYSIS (UMFC): Mucus: ABSENT

## 2019-06-03 LAB — POCT URINALYSIS DIP (MANUAL ENTRY)
Bilirubin, UA: NEGATIVE
Glucose, UA: NEGATIVE mg/dL
Ketones, POC UA: NEGATIVE mg/dL
Nitrite, UA: NEGATIVE
Protein Ur, POC: NEGATIVE mg/dL
Spec Grav, UA: 1.025 (ref 1.010–1.025)
Urobilinogen, UA: 1 E.U./dL
pH, UA: 7 (ref 5.0–8.0)

## 2019-06-03 MED ORDER — ALBUTEROL SULFATE HFA 108 (90 BASE) MCG/ACT IN AERS: 2.0000 | INHALATION_SPRAY | Freq: Four times a day (QID) | RESPIRATORY_TRACT | 3 refills | Status: DC | PRN

## 2019-06-03 NOTE — Progress Notes (Signed)
11/16/20203:53 PM  EUGENE ZEIDERS [birth year], 23 y.o., female [id number]  Chief Complaint  Patient presents with  . Establish Care    asthma meds refill    HPI:   Patient is a 23 y.o. 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 0 No  Number falls in past yr: 0 -  Injury with Fall? 0 -     Allergies  Allergen Reactions  . Peanut-Containing Drug Products Anaphylaxis  . Shellfish Allergy Anaphylaxis  . Tomato Anaphylaxis    Prior to Admission medications   Medication Sig Start Date End Date Taking? Authorizing Provider  acetaminophen (TYLENOL) 500 MG tablet Take 500 mg by mouth every 6 (six) hours as needed for mild pain, moderate pain or headache.    Yes [provider]  albuterol (PROVENTIL HFA;VENTOLIN HFA) 108 (90 Base) MCG/ACT inhaler Inhale 1-2 puffs into the lungs every 6 (six) hours as needed for wheezing. 07/11/17  Yes Jenny Reichmann, MD  albuterol (VENTOLIN HFA) 108 (90 Base) MCG/ACT inhaler Inhale 2 puffs into the lungs every 6 (six) hours as needed for wheezing or shortness of breath.  05/22/19  Yes Hawks, Christy A, FNP  diphenhydrAMINE (BENADRYL) 25 mg capsule Take 25 mg by mouth every 6 (six) hours as needed for allergies.    Yes [provider]  EPINEPHrine (EPIPEN 2-PAK) 0.3 mg/0.3 mL IJ SOAJ injection Inject 0.3 mg into the muscle once as needed (for severe allergic reaction).    [provider]  ibuprofen (ADVIL,MOTRIN) 600 MG tablet Take 1 tablet (600 mg total) by mouth every 6 (six) hours. Patient not taking: Reported on 06/03/2019 08/02/16   Leftwich-Kirby, Kathie Dike, CNM  Prenatal MV-Min-FA-Omega-3 (PRENATAL GUMMIES/DHA & FA) 0.4-32.5 MG CHEW Chew 2 each by mouth at bedtime.    [provider]    Past Medical History:  Diagnosis Date  . Asthma    last used inhaler yesterday  . Eczema     Past Surgical History:  Procedure Laterality Date  . NO PAST SURGERIES      Social History   Tobacco Use  . Smoking status: Never Smoker  . Smokeless tobacco: Never Used  Substance Use Topics  . Alcohol use: No    Family History  Problem Relation Age of Onset  . Asthma Father   . Asthma Brother   . Diabetes Maternal Uncle   . Cancer Maternal Uncle   . Cancer Maternal Grandmother   . Cancer Maternal Grandfather   . Heart disease Neg Hx   . Hypertension Neg Hx   . Hearing loss Neg Hx   . Stroke Neg Hx  ROS Per hpi  OBJECTIVE:  Today's Vitals   06/03/19 1546  BP: 124/84  Pulse: 67  Resp: 12  Temp: 97.6 F (36.4 C)  SpO2: 97%  Weight: 280 lb (127 kg)  Height: 5' 3.5" (1.613 m)   Body mass index is 48.82 kg/m.   Physical Exam Vitals signs and nursing note reviewed.  Constitutional:      Appearance: She is well-developed.  HENT:     Head: Normocephalic and atraumatic.     Mouth/Throat:     Pharynx: No oropharyngeal exudate.  Eyes:     General: No scleral icterus.    Conjunctiva/sclera: Conjunctivae normal.     Pupils: Pupils are equal, round, and reactive to light.  Neck:     Musculoskeletal: Neck supple.   Cardiovascular:     Rate and Rhythm: Normal rate and regular rhythm.     Heart sounds: Normal heart sounds. No murmur. No friction rub. No gallop.   Pulmonary:     Effort: Pulmonary effort is normal.     Breath sounds: Normal breath sounds. No wheezing, rhonchi or rales.  Skin:    General: Skin is warm and dry.  Neurological:     Mental Status: She is alert and oriented to person, place, and time.       Peak flow reading is 240, about 58 % of predicted., poor effort  Results for orders placed or performed in visit on 06/03/19 (from the past 24 hour(s))  POCT Urinalysis Microscopic ([facility])     Status: Abnormal   Collection Time: 06/03/19  4:36 PM  Result Value Ref Range   WBC,UR,HPF,POC Moderate (A) None WBC/hpf   RBC,UR,HPF,POC Few (A) None RBC/hpf   Bacteria None None, Too numerous to count   Mucus Absent Absent   Epithelial Cells, UR Per Microscopy Moderate (A) None, Too numerous to count cells/hpf  POCT urinalysis dipstick     Status: Abnormal   Collection Time: 06/03/19  4:36 PM  Result Value Ref Range   Color, UA yellow yellow   Clarity, UA cloudy (A) clear   Glucose, UA negative negative mg/dL   Bilirubin, UA negative negative   Ketones, POC UA negative negative mg/dL   Spec Grav, UA 5.456 2.563 - 1.025   Blood, UA small (A) negative   pH, UA 7.0 5.0 - 8.0   Protein Ur, POC negative negative mg/dL   Urobilinogen, UA 1.0 0.2 or 1.0 E.U./dL   Nitrite, UA Negative Negative   Leukocytes, UA Small (1+) (A) Negative    No results found.   ASSESSMENT and PLAN  1. Uncomplicated asthma, unspecified asthma severity, unspecified whether persistent Referring for PFTs/formal eval. Wondering about obesity contributor vs persistent asthma.  - Ambulatory referral to Allergy - albuterol (VENTOLIN HFA) 108 (90 Base) MCG/ACT inhaler; Inhale 2 puffs into the lungs every 6 (six) hours as needed for wheezing.  2. Urinary urgency Micro wo bacteria. RTC precautions reviewed.  -  POCT Urinalysis Microscopic ([facility]) - POCT urinalysis dipstick - Urine Culture  Return for after specialists. Myles Lipps, MD Primary Care at [facility] [address] [place], [place] [address] Ph.  [phone] Fax [phone]

## 2019-06-05 LAB — URINE CULTURE

## 2019-06-10 ENCOUNTER — Other Ambulatory Visit: Payer: Self-pay

## 2019-06-10 DIAGNOSIS — J45909 Unspecified asthma, uncomplicated: Secondary | ICD-10-CM

## 2019-06-10 DIAGNOSIS — J4520 Mild intermittent asthma, uncomplicated: Secondary | ICD-10-CM

## 2019-06-10 MED ORDER — ALBUTEROL SULFATE HFA 108 (90 BASE) MCG/ACT IN AERS: 2.0000 | INHALATION_SPRAY | Freq: Four times a day (QID) | RESPIRATORY_TRACT | 3 refills | Status: DC | PRN

## 2019-06-10 MED ORDER — ALBUTEROL SULFATE HFA 108 (90 BASE) MCG/ACT IN AERS: 2.0000 | INHALATION_SPRAY | Freq: Four times a day (QID) | RESPIRATORY_TRACT | 0 refills | Status: DC | PRN

## 2019-06-26 ENCOUNTER — Ambulatory Visit: Payer: 59 | Admitting: Allergy

## 2019-07-04 ENCOUNTER — Other Ambulatory Visit: Payer: Self-pay | Admitting: Family Medicine

## 2019-07-04 DIAGNOSIS — J4520 Mild intermittent asthma, uncomplicated: Secondary | ICD-10-CM

## 2019-07-04 MED ORDER — ALBUTEROL SULFATE HFA 108 (90 BASE) MCG/ACT IN AERS: 2.0000 | INHALATION_SPRAY | Freq: Four times a day (QID) | RESPIRATORY_TRACT | 0 refills | Status: DC | PRN

## 2019-07-31 ENCOUNTER — Other Ambulatory Visit: Payer: Self-pay | Admitting: Family Medicine

## 2019-07-31 DIAGNOSIS — J4520 Mild intermittent asthma, uncomplicated: Secondary | ICD-10-CM

## 2019-07-31 MED ORDER — ALBUTEROL SULFATE HFA 108 (90 BASE) MCG/ACT IN AERS: 2.0000 | INHALATION_SPRAY | Freq: Four times a day (QID) | RESPIRATORY_TRACT | 0 refills | Status: DC | PRN

## 2019-09-05 ENCOUNTER — Telehealth: Payer: 59 | Admitting: Physician Assistant

## 2019-09-05 DIAGNOSIS — Z20822 Contact with and (suspected) exposure to covid-19: Secondary | ICD-10-CM

## 2019-09-05 NOTE — Progress Notes (Signed)
E-Visit for Corona Virus Screening  Your current symptoms could be consistent with the coronavirus.  Many health care providers can now test patients at their office but not all are.  [facility] has multiple testing sites. For information on our COVID testing locations and hours go to [email or web address]  We are enrolling you in our MyChart Home Monitoring for COVID19 . Daily you will receive a questionnaire within the MyChart website. Our COVID 19 response team will be monitoring your responses daily.  Testing Information: The COVID-19 Community Testing sites will begin testing BY APPOINTMENT ONLY.  You can schedule online at [email or web address]  If you do not have access to a smart phone or computer you may call [phone] for an appointment.   Additional testing sites in the Community:  . For [facility] Testing sites in [facility]  [email or web address]  . For Pop-up testing sites in [place]  [email or web address]  . For Testing sites with regular hours [email or web address]  . For [facility] [email or web address]  . For [facility] [email or web address]  . For [facility] testing in [place] and [employer] [email or web address]  . For [facility] testing in [facility]   [email or web address]  For  more information about community testing call [phone]   Please quarantine yourself while awaiting your test results. Please stay home for a minimum of 10 days from the first day of illness with improving symptoms and you have had 24 hours of no fever (without the use of Tylenol (Acetaminophen)  Motrin (Ibuprofen) or any fever reducing medication).  Also - Do not get tested prior to returning to work because once you have had a positive test the test can stay positive for more then a month in some cases.   You should wear a mask or cloth face covering over your nose and mouth if you must be around other people or animals, including pets (even at home). Try to stay at least 6 feet away from other people. This will protect the people around you.  Please continue good preventive care measures, including:  frequent hand-washing, avoid touching your face, cover coughs/sneezes, stay out of crowds and keep a 6 foot distance from others.  COVID-19 is a respiratory illness with symptoms that are similar to the flu. Symptoms are typically mild to moderate, but there have been cases of severe illness and death due to the virus.   The following symptoms may appear 2-14 days after exposure: . Fever . Cough . Shortness of breath or difficulty breathing . Chills . Repeated shaking with chills . Muscle pain . Headache . Sore throat . New loss of taste or smell . Fatigue . Congestion or runny nose . Nausea or vomiting . Diarrhea  Go to the nearest hospital ED for assessment if fever/cough/breathlessness are severe or illness seems like a threat to life.  It is vitally important that if you feel that you have an infection such as this virus or any other virus that you stay home and away from places where you may spread it to others.  You should avoid contact with people age 19 and older.   You can use medication such as Delsym or Robitussin over the counter  You may also take acetaminophen (Tylenol) as needed for fever.  Reduce your risk of any infection by using the same precautions used for avoiding the common cold or flu:  Marland Kitchen Wash your hands often with soap and warm water for at least 20 seconds.  If soap and  water are not readily available, use an alcohol-based hand sanitizer with at least 60%  alcohol.  . If coughing or sneezing, cover your mouth and nose by coughing or sneezing into the elbow areas of your shirt or coat, into a tissue or into your sleeve (not your hands). . Avoid shaking hands with others and consider head nods or verbal greetings only. . Avoid touching your eyes, nose, or mouth with unwashed hands.  . Avoid close contact with people who are sick. . Avoid places or events with large numbers of people in one location, like concerts or sporting events. . Carefully consider travel plans you have or are making. . If you are planning any travel outside or inside the [place], visit the CDC's Travelers' Health webpage for the latest health notices. . If you have some symptoms but not all symptoms, continue to monitor at home and seek medical attention if your symptoms worsen. . If you are having a medical emergency, call 911.  HOME CARE . Only take medications as instructed by your medical team. . Drink plenty of fluids and get plenty of rest. . A steam or ultrasonic humidifier can help if you have congestion.   GET HELP RIGHT AWAY IF YOU HAVE EMERGENCY WARNING SIGNS** FOR COVID-19. If you or someone is showing any of these signs seek emergency medical care immediately. Call 911 or proceed to your closest emergency facility if: . You develop worsening high fever. . Trouble breathing . Bluish lips or face . Persistent pain or pressure in the chest . New confusion . Inability to wake or stay awake . You cough up blood. . Your symptoms become more severe  **This list is not all possible symptoms. Contact your medical provider for any symptoms that are sever or concerning to you.  MAKE SURE YOU   Understand these instructions.  Will watch your condition.  Will get help right away if you are not doing well or get worse.  Your e-visit answers were reviewed by a board certified advanced clinical practitioner to complete your personal care plan.  Depending on the  condition, your plan could have included both over the counter or prescription medications.  If there is a problem please reply once you have received a response from your provider.  Your safety is important to [place].  If you have drug allergies check your prescription carefully.    You can use MyChart to ask questions about today's visit, request a non-urgent call back, or ask for a work or school excuse for 24 hours related to this e-Visit. If it has been greater than 24 hours you will need to follow up with your provider, or enter a new e-Visit to address those concerns. You will get an e-mail in the next two days asking about your experience.  I hope that your e-visit has been valuable and will speed your recovery. Thank you for using e-visits.  Particia Nearing PA-C   Approximately 5 minutes was spent documenting and reviewing patient's chart.

## 2019-09-06 ENCOUNTER — Other Ambulatory Visit: Payer: Self-pay | Admitting: Family Medicine

## 2019-09-06 DIAGNOSIS — J4520 Mild intermittent asthma, uncomplicated: Secondary | ICD-10-CM

## 2019-09-06 MED ORDER — ALBUTEROL SULFATE HFA 108 (90 BASE) MCG/ACT IN AERS: 2.0000 | INHALATION_SPRAY | Freq: Four times a day (QID) | RESPIRATORY_TRACT | 1 refills | Status: DC | PRN

## 2019-11-28 ENCOUNTER — Encounter: Payer: Self-pay | Admitting: Family Medicine

## 2019-12-04 ENCOUNTER — Other Ambulatory Visit: Payer: Self-pay | Admitting: Family Medicine

## 2019-12-04 DIAGNOSIS — Z13228 Encounter for screening for other metabolic disorders: Secondary | ICD-10-CM

## 2019-12-04 DIAGNOSIS — Z131 Encounter for screening for diabetes mellitus: Secondary | ICD-10-CM

## 2019-12-04 DIAGNOSIS — Z1329 Encounter for screening for other suspected endocrine disorder: Secondary | ICD-10-CM

## 2019-12-05 ENCOUNTER — Other Ambulatory Visit: Payer: Self-pay | Admitting: Family Medicine

## 2019-12-05 ENCOUNTER — Other Ambulatory Visit: Payer: Self-pay | Admitting: Emergency Medicine

## 2019-12-05 DIAGNOSIS — J4520 Mild intermittent asthma, uncomplicated: Secondary | ICD-10-CM

## 2019-12-05 MED ORDER — ALBUTEROL SULFATE HFA 108 (90 BASE) MCG/ACT IN AERS: 2.0000 | INHALATION_SPRAY | Freq: Four times a day (QID) | RESPIRATORY_TRACT | 1 refills | Status: DC | PRN

## 2019-12-13 ENCOUNTER — Ambulatory Visit: Payer: 59

## 2020-04-08 ENCOUNTER — Other Ambulatory Visit: Payer: Self-pay | Admitting: *Deleted

## 2020-04-08 ENCOUNTER — Other Ambulatory Visit: Payer: Self-pay | Admitting: Family Medicine

## 2020-04-08 DIAGNOSIS — J4520 Mild intermittent asthma, uncomplicated: Secondary | ICD-10-CM

## 2020-04-08 MED ORDER — ALBUTEROL SULFATE HFA 108 (90 BASE) MCG/ACT IN AERS: 2.0000 | INHALATION_SPRAY | Freq: Four times a day (QID) | RESPIRATORY_TRACT | 1 refills | Status: DC | PRN

## 2020-04-08 NOTE — Telephone Encounter (Signed)
Requested medication (s) are due for refill today: Yes  Requested medication (s) are on the active medication list: Yes  Last refill:  12/05/19  Future visit scheduled: No  Notes to clinic:  Pt. Needs office visit.    Requested Prescriptions  Pending Prescriptions Disp Refills   VENTOLIN HFA 108 (90 Base) MCG/ACT inhaler [Pharmacy Med Name: Ventolin HFA 108 (90 Base) MCG/ACT Inhalation Aerosol Solution] 18 g 0    Sig: INHALE 2 PUFFS BY MOUTH EVERY 6 HOURS AS NEEDED FOR WHEEZING FOR SHORTNESS OF BREATH . APPOINTMENT REQUIRED FOR FUTURE REFILLS      Pulmonology:  Beta Agonists Failed - 04/08/2020  9:17 AM      Failed - One inhaler should last at least one month. If the patient is requesting refills earlier, contact the patient to check for uncontrolled symptoms.      Passed - Valid encounter within last 12 months    Recent Outpatient Visits           10 months ago Uncomplicated asthma, unspecified asthma severity, unspecified whether persistent   [facility] at [facility], Meda Coffee, MD

## 2020-06-01 ENCOUNTER — Telehealth: Payer: Self-pay | Admitting: Emergency Medicine

## 2020-06-01 NOTE — Telephone Encounter (Signed)
Pt scheduled appt for med refill for 06/10/20

## 2020-06-01 NOTE — Telephone Encounter (Signed)
Pt will be running out of her Albuterol and needs a refill sent to  [facility] [address] [place], [place] - [address]  [address] [place] [address]  Phone: [phone] Fax: [phone]

## 2020-06-01 NOTE — Telephone Encounter (Signed)
Patient is requesting albuterol refill. Pt has been seen once by previous doctor on 05/2019 , will need an appt in order to cover her refill.

## 2020-06-02 ENCOUNTER — Other Ambulatory Visit: Payer: Self-pay

## 2020-06-02 DIAGNOSIS — J4520 Mild intermittent asthma, uncomplicated: Secondary | ICD-10-CM

## 2020-06-02 MED ORDER — ALBUTEROL SULFATE HFA 108 (90 BASE) MCG/ACT IN AERS: 2.0000 | INHALATION_SPRAY | Freq: Four times a day (QID) | RESPIRATORY_TRACT | 1 refills | Status: DC | PRN

## 2020-06-10 ENCOUNTER — Ambulatory Visit: Payer: 59 | Admitting: Family Medicine

## 2020-06-15 ENCOUNTER — Encounter: Payer: Self-pay | Admitting: Family Medicine

## 2022-03-10 ENCOUNTER — Encounter: Payer: Self-pay | Admitting: Family Medicine

## 2023-06-02 DIAGNOSIS — J189 Pneumonia, unspecified organism: Secondary | ICD-10-CM | POA: Diagnosis not present

## 2023-06-02 DIAGNOSIS — J4531 Mild persistent asthma with (acute) exacerbation: Secondary | ICD-10-CM | POA: Diagnosis not present

## 2023-06-03 ENCOUNTER — Other Ambulatory Visit (HOSPITAL_COMMUNITY): Payer: Self-pay

## 2023-06-03 MED ORDER — LEVALBUTEROL TARTRATE 45 MCG/ACT IN AERO
2.0000 | INHALATION_SPRAY | RESPIRATORY_TRACT | 0 refills | Status: DC | PRN
  Filled 2023-06-03: qty 15, 17d supply, fill #0

## 2023-06-05 ENCOUNTER — Other Ambulatory Visit (HOSPITAL_COMMUNITY): Payer: Self-pay

## 2023-06-14 ENCOUNTER — Other Ambulatory Visit (HOSPITAL_COMMUNITY): Payer: Self-pay

## 2023-07-05 DIAGNOSIS — R053 Chronic cough: Secondary | ICD-10-CM | POA: Diagnosis not present

## 2023-07-05 DIAGNOSIS — R062 Wheezing: Secondary | ICD-10-CM | POA: Diagnosis not present

## 2023-07-05 DIAGNOSIS — R0602 Shortness of breath: Secondary | ICD-10-CM | POA: Diagnosis not present

## 2023-07-05 DIAGNOSIS — J45991 Cough variant asthma: Secondary | ICD-10-CM | POA: Diagnosis not present

## 2023-10-10 DIAGNOSIS — Z7689 Persons encountering health services in other specified circumstances: Secondary | ICD-10-CM | POA: Diagnosis not present

## 2023-10-10 DIAGNOSIS — Z1329 Encounter for screening for other suspected endocrine disorder: Secondary | ICD-10-CM | POA: Diagnosis not present

## 2023-10-10 DIAGNOSIS — Z113 Encounter for screening for infections with a predominantly sexual mode of transmission: Secondary | ICD-10-CM | POA: Diagnosis not present

## 2023-10-11 ENCOUNTER — Telehealth: Payer: Self-pay

## 2023-10-11 NOTE — Telephone Encounter (Signed)
 Received referral from [facility] - [facility] - for consultation of OSA. C/o snoring, fatigue and daytime sleepiness after rapid weight gain after pregnancy. BMI 56.38. placed in sleep mailbox

## 2023-10-17 ENCOUNTER — Encounter (INDEPENDENT_AMBULATORY_CARE_PROVIDER_SITE_OTHER): Payer: Self-pay | Admitting: Internal Medicine

## 2023-10-17 ENCOUNTER — Ambulatory Visit (INDEPENDENT_AMBULATORY_CARE_PROVIDER_SITE_OTHER): Admitting: Internal Medicine

## 2023-10-17 VITALS — BP 134/84 | HR 93 | Temp 98.5°F | Ht 63.0 in | Wt 320.0 lb

## 2023-10-17 DIAGNOSIS — R29818 Other symptoms and signs involving the nervous system: Secondary | ICD-10-CM

## 2023-10-17 DIAGNOSIS — R7303 Prediabetes: Secondary | ICD-10-CM

## 2023-10-17 DIAGNOSIS — Z6841 Body Mass Index (BMI) 40.0 and over, adult: Secondary | ICD-10-CM

## 2023-10-17 DIAGNOSIS — Z0289 Encounter for other administrative examinations: Secondary | ICD-10-CM

## 2023-10-17 DIAGNOSIS — E66813 Obesity, class 3: Secondary | ICD-10-CM

## 2023-10-17 DIAGNOSIS — E559 Vitamin D deficiency, unspecified: Secondary | ICD-10-CM | POA: Insufficient documentation

## 2023-10-17 NOTE — Progress Notes (Signed)
 Office: [phone]  /  Fax: [phone]   Initial Visit  Cassandra Foley was seen in clinic today to evaluate for obesity. She is interested in losing weight to improve overall health and reduce the risk of weight related complications. She presents today to review program treatment options, initial physical assessment, and evaluation.     Discussed the use of AI scribe software for clinical note transcription with the patient, who gave verbal consent to proceed.  History of Present Illness Cassandra Foley is a 28 year old female with prediabetes who presents for medical weight management. She was referred by Nurse Practitioner Karena Addison for medical weight management.  She seeks to lose weight, improve energy levels, and learn healthier eating habits. She wants to be more active with her seven-year-old daughter, who is involved in cheer and gymnastics.  She has been diagnosed with prediabetes, with an A1c of 6.0, and is taking metformin 850 mg twice daily. She experiences lower back pain when walking, which she attributes to weight gain, and knee pain after working long shifts, impacting her ability to remain active. She works twelve-hour shifts and often needs to sit down due to discomfort.  She is scheduled for a sleep study due to a history of loud snoring and difficulty falling and staying asleep. She feels tired and fatigued, which she attributes to potential sleep apnea.  She has a vitamin D deficiency, with levels at 10, and is on a once-a-week vitamin D supplement. Her previous healthcare provider closed, and she has not been regularly monitored until recently.  Her father is perceived as overweight, and her brother is obese, indicating a potential genetic predisposition to weight gain. She acknowledges stress eating and convenience foods as contributing factors to her weight gain. She is currently not following any specific nutrition plan and describes her physical activity level  as low, aside from work-related activity and occasional treadmill use.    Weight promoting medications identified: None  Current nutrition plan: None  Current level of physical activity: None  Current or previous pharmacotherapy: None  Response to medication: Never tried medications   Past medical history includes:   Past Medical History:  Diagnosis Date   Asthma    last used inhaler yesterday   Eczema      Objective:   BP 134/84   Pulse 93   Temp 98.5 F (36.9 C)   Ht 5\' 3"  (1.6 m)   Wt (!) 320 lb (145.2 kg)   LMP 10/17/2023   SpO2 96%   BMI 56.69 kg/m  She was weighed on the bioimpedance scale: Body mass index is 56.69 kg/m.  Peak Weight:322 , Body Fat%:55, Visceral Fat Rating:20, Weight trend over the last 12 months: Increasing  General:  Alert, oriented and cooperative. Patient is in no acute distress.  Respiratory: Normal respiratory effort, no problems with respiration noted   Gait: able to ambulate independently  Mental Status: Normal mood and affect. Normal behavior. Normal judgment and thought content.   DIAGNOSTIC DATA REVIEWED:  BMET    Component Value Date/Time   NA 139 10/28/2008 1711   K 4.2 10/28/2008 1711   CL 105 10/28/2008 1711   GLUCOSE 89 10/28/2008 1711   BUN <3 (L) 10/28/2008 1711   CREATININE 0.7 10/28/2008 1711   No results found for: "HGBA1C" No results found for: "INSULIN" CBC    Component Value Date/Time   WBC 4.9 07/31/2016 0430   RBC 3.95 07/31/2016 0430   HGB 12.2 07/31/2016 0430  HGB 12.1 04/20/2016 1030   HCT 34.9 (L) 07/31/2016 0430   HCT 36.3 04/20/2016 1030   PLT 160 07/31/2016 0430   PLT 169 04/20/2016 1030   MCV 88.4 07/31/2016 0430   MCV 93 04/20/2016 1030   MCH 30.9 07/31/2016 0430   MCHC 35.0 07/31/2016 0430   RDW 14.1 07/31/2016 0430   RDW 14.9 04/20/2016 1030   Iron/TIBC/Ferritin/ %Sat No results found for: "IRON", "TIBC", "FERRITIN", "IRONPCTSAT" Lipid Panel  No results found for: "CHOL",  "TRIG", "HDL", "CHOLHDL", "VLDL", "LDLCALC", "LDLDIRECT" Hepatic Function Panel  No results found for: "PROT", "ALBUMIN", "AST", "ALT", "ALKPHOS", "BILITOT", "BILIDIR", "IBILI" No results found for: "TSH"   Assessment and Plan:   Class 3 severe obesity with serious comorbidity and body mass index (BMI) of 50.0 to 59.9 in adult, unspecified obesity type (HCC)  Suspected sleep apnea  Prediabetes    Assessment and Plan Assessment & Plan Obesity She seeks medical weight management for obesity, affecting her lower back and knees, and desires improved energy levels for active participation with her daughter. Contributing factors include stress, convenience eating, genetics, and lack of sleep. She is not on weight loss medications and lacks a specific nutrition plan. Emphasized the importance of a balanced diet and regular physical activity, noting the challenges of weight loss due to the body's tendency to favor weight gain. Explained that weight loss expectations are about 1-2 pounds per week, which is gradual and sustainable. Highlighted that exercise alone rarely results in weight loss without dietary changes. - Enroll in medically supervised weight management program - Conduct IC to assess metabolic rate - Provide a meal plan and nutritional guidance - Schedule follow-up visits every 2-3 weeks for the first 3 months, then monthly - Discuss potential use of weight loss medications if needed  We reviewed anthropometrics, biometrics, associated medical conditions and contributing factors with patient. she would benefit from a medically tailored reduced calorie nutrional plan based on her REE (resting energy expenditure), which will be determined by indirect calorimetry.  We will also assess for cardiometabolic risk and nutritional derangements via fasting labs at intake appointment.    Prediabetes Diagnosed with prediabetes, with an A1c level of 6.0%. Prescribed metformin 850 mg twice daily  to manage blood sugar levels and assist with weight management. Explained the significance of the A1c test and the potential to reverse prediabetes through weight loss and dietary changes. Emphasized that metformin aids in weight management and blood sugar control. - Continue metformin 850 mg twice daily - Monitor blood sugar levels regularly - Incorporate dietary changes to manage blood sugar -Losing 7 to 10% of body weight may improve condition  Suspected Sleep Apnea Loud snoring and difficulty sleeping suggest sleep apnea. Scheduled for a sleep study with [place] neurologist. Explained potential health impacts of sleep apnea, including effects on energy levels, mood, and weight management. - Proceed with scheduled sleep study -Losing 15% of total body weight may improve condition   Vitamin D Deficiency Diagnosed with vitamin D deficiency, with a level of 10 ng/mL. Prescribed a once-weekly vitamin D supplement. Explained that darker skin and increased body fat can contribute to lower vitamin D levels, causing fatigue and achiness. - Continue once-weekly vitamin D supplementation  General Health Maintenance Normal kidney function, liver enzymes, and cholesterol levels. Emphasized maintaining a healthy diet and regular physical activity to support overall health and prevent chronic diseases. Discussed benefits of protein intake for appetite suppression and metabolism boosting. - Encourage regular physical activity - Promote a balanced  diet with adequate protein intake  Follow-up Advised regular follow-up as part of the weight management program to monitor progress and adjust the treatment plan as needed. Informed about the need to complete a health questionnaire, fast for 8 hours, and arrive an hour early for the next visit for metabolic testing. - Schedule follow-up visits every 2-3 weeks for the first 3 months, then monthly - Ensure completion of health questionnaire before the next  visit - Fast for 8 hours before the next visit for metabolic testing - Arrive an hour early for the next visit for EKG and breathing test        Obesity Treatment / Action Plan:  Patient will work on garnering support from family and friends to begin weight loss journey. Will work on eliminating or reducing the presence of highly palatable, calorie dense foods in the home. Will complete provided nutritional and psychosocial assessment questionnaire before the next appointment. Will be scheduled for indirect calorimetry to determine resting energy expenditure in a fasting state.  This will allow [place] to create a reduced calorie, high-protein meal plan to promote loss of fat mass while preserving muscle mass. Counseled on the health benefits of losing 5%-15% of total body weight. Was counseled on nutritional approaches to weight loss and benefits of reducing processed foods and consuming plant-based foods and high quality protein as part of nutritional weight management. Was counseled on pharmacotherapy and role as an adjunct in weight management.   Obesity Education Performed Today:  She was weighed on the bioimpedance scale and results were discussed and documented in the synopsis.  We discussed obesity as a disease and the importance of a more detailed evaluation of all the factors contributing to the disease.  We discussed the importance of long term lifestyle changes which include nutrition, exercise and behavioral modifications as well as the importance of customizing this to her specific health and social needs.  We discussed the benefits of reaching a healthier weight to alleviate the symptoms of existing conditions and reduce the risks of the biomechanical, metabolic and psychological effects of obesity.  Cassandra Foley appears to be in the action stage of change and states they are ready to start intensive lifestyle modifications and behavioral modifications.  I have spent 40  minutes in the care of the patient today including: preparing to see patient (e.g. review and interpretation of tests, old notes ), obtaining and/or reviewing separately obtained history, performing a medically appropriate examination or evaluation, counseling and educating the patient, documenting clinical information in the electronic or other health care record, and independently interpreting results and communicating results to the patient, family, or caregiver   Reviewed by clinician on day of visit: allergies, medications, problem list, medical history, surgical history, family history, social history, and previous encounter notes pertinent to obesity diagnosis.   Worthy Rancher, MD

## 2023-10-25 ENCOUNTER — Encounter: Payer: Self-pay | Admitting: Internal Medicine

## 2023-10-25 ENCOUNTER — Other Ambulatory Visit: Payer: Self-pay

## 2023-10-25 ENCOUNTER — Ambulatory Visit (INDEPENDENT_AMBULATORY_CARE_PROVIDER_SITE_OTHER): Admitting: Internal Medicine

## 2023-10-25 VITALS — BP 118/72 | HR 79 | Temp 98.5°F | Resp 16 | Ht 63.25 in | Wt 318.2 lb

## 2023-10-25 DIAGNOSIS — T7801XA Anaphylactic reaction due to peanuts, initial encounter: Secondary | ICD-10-CM

## 2023-10-25 DIAGNOSIS — T7801XD Anaphylactic reaction due to peanuts, subsequent encounter: Secondary | ICD-10-CM | POA: Diagnosis not present

## 2023-10-25 DIAGNOSIS — J393 Upper respiratory tract hypersensitivity reaction, site unspecified: Secondary | ICD-10-CM | POA: Diagnosis not present

## 2023-10-25 DIAGNOSIS — J4530 Mild persistent asthma, uncomplicated: Secondary | ICD-10-CM | POA: Diagnosis not present

## 2023-10-25 DIAGNOSIS — T7802XD Anaphylactic reaction due to shellfish (crustaceans), subsequent encounter: Secondary | ICD-10-CM

## 2023-10-25 DIAGNOSIS — J3089 Other allergic rhinitis: Secondary | ICD-10-CM

## 2023-10-25 DIAGNOSIS — T7802XA Anaphylactic reaction due to shellfish (crustaceans), initial encounter: Secondary | ICD-10-CM

## 2023-10-25 DIAGNOSIS — J45909 Unspecified asthma, uncomplicated: Secondary | ICD-10-CM | POA: Diagnosis not present

## 2023-10-25 DIAGNOSIS — J45998 Other asthma: Secondary | ICD-10-CM | POA: Diagnosis not present

## 2023-10-25 MED ORDER — FLUTICASONE PROPIONATE HFA 110 MCG/ACT IN AERO: 2.0000 | INHALATION_SPRAY | Freq: Two times a day (BID) | RESPIRATORY_TRACT | 5 refills | Status: DC

## 2023-10-25 MED ORDER — MONTELUKAST SODIUM 10 MG PO TABS: 10.0000 mg | ORAL_TABLET | Freq: Every day | ORAL | 1 refills | Status: DC

## 2023-10-25 NOTE — Patient Instructions (Addendum)
 Mild Persistent Asthma: - Maintenance inhaler: start Flovent 2 puffs twice daily with spacer. Continue Singulair 10mg  daily.  - Rescue inhaler: Albuterol or Xopenex 2 puffs via spacer or 1 vial via nebulizer every 4-6 hours as needed for respiratory symptoms of cough, shortness of breath, or wheezing Asthma control goals:  Full participation in all desired activities (may need albuterol before activity) Albuterol use two times or less a week on average (not counting use with activity) Cough interfering with sleep two times or less a month Oral steroids no more than once a year No hospitalizations  Other Allergic Rhinitis: - Use Singulair 10mg  daily.  Stop if there are any mood/behavioral changes. - Use Zyrtec 10mg  daily.    Food Allergy:  - please strictly avoid shellfish, peanuts, tomato.  - for SKIN only reaction, okay to take Benadryl 25mg  capsules every 6 hours as needed - for SKIN + ANY additional symptoms, OR IF concern for LIFE THREATENING reaction = Epipen Autoinjector EpiPen 0.3 mg. - If using Epinephrine autoinjector, call 911 or go to the ER.    Hold all anti-histamines (Xyzal, Allegra, Zyrtec, Claritin, Benadryl, Pepcid) 3 days prior to next visit.  Follow up: 130 for skin testing on 4/16 1-55, shellfish individual and mix, peanut, tomato

## 2023-10-25 NOTE — Progress Notes (Signed)
 NEW PATIENT  Date of Service/Encounter:  10/25/23  Consult requested by: Dot Been, FNP   Subjective:   Cassandra Foley (DOB: [birth year]) is a 28 y.o. female who presents to the clinic on 10/25/2023 with a chief complaint of Asthma and Advice Only .    History obtained from: chart review and patient.   Asthma:  Diagnosed since childhood but worsened in adulthood.  Trouble with SOB/wheezing even with minimal activity throughout the day.  She is working on weight loss but having a hard time exercising, needing albuterol almost daily.  Several times a week daytime symptoms in past month, none  nighttime awakenings in past month Using rescue inhaler: multiple times a week  Limitations to daily activity: mild 2 ED visits/UC visits and 1 oral steroids in the past year 0 number of lifetime hospitalizations, 0 number of lifetime intubations.  Identified Triggers: exercise, weather change  Prior PFTs or spirometry: none available for review  Previously used therapies:  Current regimen:  Maintenance: Singulair  Rescue: Albuterol or Xopenex 2 puffs q4-6 hrs PRN  Rhinitis:  Started since childhood.   Symptoms include: nasal congestion, rhinorrhea, post nasal drainage, and itchy eyes  Occurs seasonally-Spring/Summer  Potential triggers: pollen   Treatments tried:  Benadryl Singulair   Previous allergy testing: no History of sinus surgery: no Nonallergic triggers: none    Concern for Food Allergy:  History of reaction:  Shellfish- throat closing, lip swelling, facial rash, warm feeling; occurred 4-5 years ago but now eats it here and there but takes benadryl first  Peanut- throat closing, facial rash; occurred around 2020 and ate peanuts a few years ago but without a reaction  Tomato- lip swelling   Previous allergy testing no  Carries an epinephrine autoinjector: yes  Reviewed:  10/10/2023: seen by Grace Blight NP for asthma, food allergies, allergic rhinitis, referred to  Allergist. On Singulair, Xopenex, PRN Epipen.    07/05/2023: seen in urgent care for cough and wheezing.Wheezing noted on exam. CXR normal. Given steroid injection and started Singulair.   06/02/2023: seen in urgent care for cough/wheezing. Noted to have wheezing on exam. Started on augmentin + azithromycin for possible pneumonia, Xopenex PRN for asthma.   Past Medical History: Past Medical History:  Diagnosis Date   Asthma    last used inhaler yesterday   Eczema    Past Surgical History: Past Surgical History:  Procedure Laterality Date   NO PAST SURGERIES      Family History: Family History  Problem Relation Age of Onset   Asthma Father    Asthma Brother    Diabetes Maternal Uncle    Cancer Maternal Uncle    Cancer Maternal Grandmother    Cancer Maternal Grandfather    Heart disease Neg Hx    Hypertension Neg Hx    Hearing loss Neg Hx    Stroke Neg Hx     Social History:  Flooring in bedroom: wood Pets: dog Tobacco use/exposure: none Job: nursing tech   Medication List:  Allergies as of 10/25/2023       Reactions   Peanut-containing Drug Products Anaphylaxis   Shellfish Allergy Anaphylaxis   Tomato Anaphylaxis        Medication List        Accurate as of October 25, 2023  5:06 PM. If you have any questions, ask your nurse or doctor.          STOP taking these medications    albuterol 108 (90 Base) MCG/ACT inhaler  Commonly known as: VENTOLIN HFA Stopped by: Birder Robson   Prenatal Gummies/DHA & FA 0.4-32.5 MG Chew Stopped by: Birder Robson       TAKE these medications    DERMAPHOR EX Apply 1 Application topically as needed.   diphenhydrAMINE 25 mg capsule Commonly known as: BENADRYL Take 25 mg by mouth every 6 (six) hours as needed for allergies.   EpiPen 2-Pak 0.3 mg/0.3 mL Soaj injection Generic drug: EPINEPHrine Inject 0.3 mg into the muscle once as needed (for severe allergic reaction).   EPINEPHrine 0.3 mg/0.3 mL Soaj  injection Commonly known as: EPI-PEN Inject 0.3 mg into the muscle as needed for anaphylaxis.   fluticasone 110 MCG/ACT inhaler Commonly known as: FLOVENT HFA Inhale 2 puffs into the lungs in the morning and at bedtime. Started by: Birder Robson   ibuprofen 600 MG tablet Commonly known as: ADVIL Take 1 tablet (600 mg total) by mouth every 6 (six) hours.   levalbuterol 45 MCG/ACT inhaler Commonly known as: XOPENEX HFA Inhale 2 puffs into the lungs every 4 (four) hours as needed for wheezing.   metFORMIN 850 MG tablet Commonly known as: GLUCOPHAGE Take 850 mg by mouth 2 (two) times daily.   montelukast 10 MG tablet Commonly known as: SINGULAIR Take 10 mg by mouth daily.   Vitamin D (Ergocalciferol) 1.25 MG (50000 UNIT) Caps capsule Commonly known as: DRISDOL Take 50,000 Units by mouth once a week.         REVIEW OF SYSTEMS: Pertinent positives and negatives discussed in HPI.   Objective:   Physical Exam: BP 118/72 (BP Location: Right Arm, Patient Position: Sitting, Cuff Size: Large)   Pulse 79   Temp 98.5 F (36.9 C) (Temporal)   Resp 16   Ht 5' 3.25" (1.607 m)   Wt (!) 318 lb 3.2 oz (144.3 kg)   LMP 10/17/2023   SpO2 97%   BMI 55.92 kg/m  Body mass index is 55.92 kg/m. GEN: alert, well developed HEENT: clear conjunctiva, nose with + mild inferior turbinate hypertrophy, pink nasal mucosa, +clear rhinorrhea, + cobblestoning HEART: regular rate and rhythm, no murmur LUNGS: clear to auscultation bilaterally, no coughing, unlabored respiration ABDOMEN: soft, non distended  SKIN: no rashes or lesions  Spirometry:  Tracings reviewed. Her effort: Good reproducible efforts. FVC: 2.63L, 83% predicted; post 3.38L, 106% FEV1: 1.56L, 57% predicted; post 2.15L, 79% FEV1/FVC ratio: 59% Interpretation: Spirometry consistent with moderate obstructive disease. Post bronchodilator with significant reversibility.  Please see scanned spirometry results for  details.   Assessment:   1. Other allergic rhinitis   2. Anaphylactic shock due to shellfish, initial encounter   3. Mild persistent asthma without complication   4. Peanut-induced anaphylaxis, initial encounter   5. Upper respiratory tract hypersensitivity reaction     Plan/Recommendations:  Mild Persistent Asthma: - MDI technique discussed.  Uncontrolled; also spirometry with reversible obstruction. Will start ICS. Spacer given.  - Maintenance inhaler: start Flovent 2 puffs twice daily with spacer. Continue Singulair 10mg  daily.  - Rescue inhaler: Albuterol or Xopenex 2 puffs via spacer or 1 vial via nebulizer every 4-6 hours as needed for respiratory symptoms of cough, shortness of breath, or wheezing Asthma control goals:  Full participation in all desired activities (may need albuterol before activity) Albuterol use two times or less a week on average (not counting use with activity) Cough interfering with sleep two times or less a month Oral steroids no more than once a year No hospitalizations  Other  Allergic Rhinitis: - Due to turbinate hypertrophy, seasonal symptoms, asthma and unresponsive to over the counter meds, will perform skin testing to identify aeroallergen triggers.   - Use Singulair 10mg  daily.  Stop if there are any mood/behavioral changes. - Use Zyrtec 10mg  daily.    Food Allergy:  - please strictly avoid shellfish, peanuts, tomato.  - Initial rxn- shellfish with throat closing/lip swelling/facial rash/warmth; peanuts with throat closing/facial rash; tomato with lip swelling.  - for SKIN only reaction, okay to take Benadryl 25mg  capsules every 6 hours as needed - for SKIN + ANY additional symptoms, OR IF concern for LIFE THREATENING reaction = Epipen Autoinjector EpiPen 0.3 mg. - If using Epinephrine autoinjector, call 911 or go to the ER.    Hold all anti-histamines (Xyzal, Allegra, Zyrtec, Claritin, Benadryl, Pepcid) 3 days prior to next visit.    Follow up: 130 4/16  for skin testing 1-55, shellfish individual and mix, peanut, tomato; IDs okay    Alesia Morin, MD [facility]

## 2023-11-01 ENCOUNTER — Ambulatory Visit: Payer: Self-pay | Admitting: Internal Medicine

## 2023-11-01 DIAGNOSIS — J309 Allergic rhinitis, unspecified: Secondary | ICD-10-CM

## 2023-11-03 LAB — ALLERGEN PROFILE, SHELLFISH
Clam IgE: 1.94 kU/L — AB
F023-IgE Crab: 11.3 kU/L — AB
F080-IgE Lobster: 14 kU/L — AB
F290-IgE Oyster: 0.95 kU/L — AB
Scallop IgE: 2.56 kU/L — AB
Shrimp IgE: 13.1 kU/L — AB

## 2023-11-03 LAB — PEANUT COMPONENTS
F352-IgE Ara h 8: 9.99 kU/L — AB
F422-IgE Ara h 1: <0.1 kU/L
F423-IgE Ara h 2: <0.1 kU/L
F424-IgE Ara h 3: <0.1 kU/L
F427-IgE Ara h 9: 12.3 kU/L — AB
F447-IgE Ara h 6: <0.1 kU/L

## 2023-11-03 LAB — IGE PEANUT W/COMPONENT REFLEX: Peanut, IgE: 3.95 kU/L — AB

## 2023-11-03 LAB — ALLERGEN COMPONENT COMMENTS

## 2023-11-03 LAB — ALLERGEN, TOMATO F25: Allergen Tomato, IgE: 2.96 kU/L — AB

## 2023-11-08 ENCOUNTER — Encounter: Payer: Self-pay | Admitting: Neurology

## 2023-11-08 ENCOUNTER — Institutional Professional Consult (permissible substitution): Admitting: Neurology

## 2023-11-14 ENCOUNTER — Ambulatory Visit (INDEPENDENT_AMBULATORY_CARE_PROVIDER_SITE_OTHER): Admitting: Internal Medicine

## 2023-11-14 DIAGNOSIS — J3081 Allergic rhinitis due to animal (cat) (dog) hair and dander: Secondary | ICD-10-CM

## 2023-11-14 DIAGNOSIS — J3089 Other allergic rhinitis: Secondary | ICD-10-CM

## 2023-11-14 DIAGNOSIS — J301 Allergic rhinitis due to pollen: Secondary | ICD-10-CM

## 2023-11-14 DIAGNOSIS — T7801XD Anaphylactic reaction due to peanuts, subsequent encounter: Secondary | ICD-10-CM | POA: Diagnosis not present

## 2023-11-14 DIAGNOSIS — T7802XD Anaphylactic reaction due to shellfish (crustaceans), subsequent encounter: Secondary | ICD-10-CM | POA: Diagnosis not present

## 2023-11-14 MED ORDER — LEVALBUTEROL TARTRATE 45 MCG/ACT IN AERO: 2.0000 | INHALATION_SPRAY | Freq: Four times a day (QID) | RESPIRATORY_TRACT | 1 refills | Status: DC | PRN

## 2023-11-14 MED ORDER — EPINEPHRINE 0.3 MG/0.3ML IJ SOAJ: 0.3000 mg | INTRAMUSCULAR | 1 refills | Status: DC | PRN

## 2023-11-14 MED ORDER — CETIRIZINE HCL 10 MG PO TABS: 10.0000 mg | ORAL_TABLET | Freq: Every day | ORAL | 5 refills | Status: DC

## 2023-11-14 MED ORDER — AZELASTINE HCL 0.1 % NA SOLN: 2.0000 | Freq: Two times a day (BID) | NASAL | 5 refills | Status: DC | PRN

## 2023-11-14 MED ORDER — FLUTICASONE PROPIONATE HFA 110 MCG/ACT IN AERO: 2.0000 | INHALATION_SPRAY | Freq: Two times a day (BID) | RESPIRATORY_TRACT | 5 refills | Status: DC

## 2023-11-14 MED ORDER — MONTELUKAST SODIUM 10 MG PO TABS
10.0000 mg | ORAL_TABLET | Freq: Every day | ORAL | 1 refills | Status: DC
Start: 2023-11-14 — End: 2023-11-30

## 2023-11-14 MED ORDER — FLUTICASONE PROPIONATE 50 MCG/ACT NA SUSP: 2.0000 | Freq: Every day | NASAL | 5 refills | Status: DC

## 2023-11-14 NOTE — Patient Instructions (Addendum)
 Allergic Rhinitis: - Positive skin test 10/2023: trees, grasses, weeds, molds, dust mites, cats, dogs, feathers, horses, cockraoch - Use nasal saline rinses before nose sprays such as with Neilmed Sinus Rinse.  Use distilled water.   - Use Flonase  2 sprays each nostril daily. Aim upward and outward. - Use Azelastine 2 sprays each nostril twice daily as needed for runny nose, drainage, sneezing, congestion. Aim upward and outward. - Use Zyrtec 10 mg daily.  - Use Singulair  10mg  daily.  Stop if there are any mood/behavioral changes. - Consider allergy shots as long term control of your symptoms by teaching your immune system to be more tolerant of your allergy triggers    Mild Persistent Asthma: - Maintenance inhaler: start Flovent  110mcg 2 puffs twice daily with spacer. Continue Singulair  10mg  daily.  - Rescue inhaler: Albuterol  or Xopenex  2 puffs via spacer or 1 vial via nebulizer every 4-6 hours as needed for respiratory symptoms of cough, shortness of breath, or wheezing Asthma control goals:  Full participation in all desired activities (may need albuterol  before activity) Albuterol  use two times or less a week on average (not counting use with activity) Cough interfering with sleep two times or less a month Oral steroids no more than once a year No hospitalizations    Food Allergy:  - please strictly avoid shellfish and peanuts - for SKIN only reaction, okay to take Benadryl  25mg  capsules every 6 hours as needed - for SKIN + ANY additional symptoms, OR IF concern for LIFE THREATENING reaction = Epipen  Autoinjector EpiPen  0.3 mg. - If using Epinephrine  autoinjector, call 911 or go to the ER.    Oral Allergy Syndrome- Tomato - Okay to eat tomato but can result in lip/mouth itching/swelling.  - These symptoms are typically not life-threatening and are because of a cross reaction between a pollen you are allergic to, and to a protein in specific foods (such as fresh fruits, vegetables,  and nuts). - If you can eat these things and tolerate the symptoms, it is fine to continue to do so.  If not, you may avoid these fresh fruits and vegetables.   - Heating these foods, buying them canned, and peeling these foods should allow them to be consumed without symptoms or with less symptoms. - Patients typically report itching and/or mild swelling of the mouth and throat immediately following ingestion of certain uncooked fruits (including nuts) or raw vegetables.  - Only a very small number of affected individuals experience systemic allergic reactions, such as anaphylaxis which occurs with true food allergies.      ALLERGEN AVOIDANCE MEASURES   Dust Mites Use central air conditioning and heat; and change the filter monthly.  Pleated filters work better than mesh filters.  Electrostatic filters may also be used; wash the filter monthly.  Window air conditioners may be used, but do not clean the air as well as a central air conditioner.  Change or wash the filter monthly. Keep windows closed.  Do not use attic fans.   Encase the mattress, box springs and pillows with zippered, dust proof covers. Wash the bed linens in hot water weekly.   Remove carpet, especially from the bedroom. Remove stuffed animals, throw pillows, dust ruffles, heavy drapes and other items that collect dust from the bedroom. Do not use a humidifier.   Use wood, vinyl or leather furniture instead of cloth furniture in the bedroom. Keep the indoor humidity at 30 - 40%.  Monitor with a humidity gauge.  Molds -  Indoor avoidance Use air conditioning to reduce indoor humidity.  Do not use a humidifier. Keep indoor humidity at 30 - 40%.  Use a dehumidifier if needed. In the bathroom use an exhaust fan or open a window after showering.  Wipe down damp surfaces after showering.  Clean bathrooms with a mold-killing solution (diluted bleach, or products like Tilex, etc) at least once a month. In the kitchen use an  exhaust fan to remove steam from cooking.  Throw away spoiled foods immediately, and empty garbage daily.  Empty water pans below self-defrosting refrigerators frequently. Vent the clothes dryer to the outside. Limit indoor houseplants; mold grows in the dirt.  No houseplants in the bedroom. Remove carpet from the bedroom. Encase the mattress and box springs with a zippered encasing.  Molds - Outdoor avoidance Avoid being outside when the grass is being mowed, or the ground is tilled. Avoid playing in leaves, pine straw, hay, etc.  Dead plant materials contain mold. Avoid going into barns or grain storage areas. Remove leaves, clippings and compost from around the home.  Cockroach Limit spread of food around the house; especially keep food out of bedrooms. Keep food and garbage in closed containers with a tight lid.  Never leave food out in the kitchen.  Do not leave out pet food or dirty food bowls. Mop the kitchen floor and wash countertops at least once a week. Repair leaky pipes and faucets so there is no standing water to attract roaches. Plug up cracks in the house through which cockroaches can enter. Use bait stations and approved pesticides to reduce cockroach infestation. Pollen Avoidance Pollen levels are highest during the mid-day and afternoon.  Consider this when planning outdoor activities. Avoid being outside when the grass is being mowed, or wear a mask if the pollen-allergic person must be the one to mow the grass. Keep the windows closed to keep pollen outside of the home. Use an air conditioner to filter the air. Take a shower, wash hair, and change clothing after working or playing outdoors during pollen season. Pet Dander Keep the pet out of your bedroom and restrict it to only a few rooms. Be advised that keeping the pet in only one room will not limit the allergens to that room. Don't pet, hug or kiss the pet; if you do, wash your hands with soap and  water. High-efficiency particulate air (HEPA) cleaners run continuously in a bedroom or living room can reduce allergen levels over time. Regular use of a high-efficiency vacuum cleaner or a central vacuum can reduce allergen levels. Giving your pet a bath at least once a week can reduce airborne allergen.

## 2023-11-14 NOTE — Progress Notes (Signed)
 FOLLOW UP Date of Service/Encounter:  11/14/23   Subjective:  Cassandra Foley (DOB: 05-09-1996) is a 28 y.o. female who [facility] for follow up for skin testing.   History obtained from: chart review and patient.  Anti histamines held.  Reports eating tomatoes without any issues the past few days.   Past Medical History: Past Medical History:  Diagnosis Date   Asthma    last used inhaler yesterday   Eczema     Objective:  LMP 10/17/2023  There is no height or weight on file to calculate BMI. Physical Exam: GEN: alert, well developed HEENT: clear conjunctiva, MMM LUNGS: unlabored respiration .  Skin Testing:  Skin prick testing was placed, which includes aeroallergens/foods, histamine control, and saline control.  Verbal consent was obtained prior to placing test.  Patient tolerated procedure well.  Allergy testing results were read and interpreted by myself, documented by clinical staff. Adequate positive and negative control.  Positive results to:  Results discussed with patient/family.  Airborne Adult Perc - 11/14/23 1424     Time Antigen Placed 1424    Allergen Manufacturer Floyd Hutchinson    Location Back    Number of Test 55    Panel 1 Select    1. Control-Buffer 50% Glycerol Negative    2. Control-Histamine 3+    3. [place]ia 3+    4. [place] 3+    5. Johnson 3+    6. [place]  Blue 3+    7. Meadow Fescue 3+    8. Perennial Rye 3+    9. Timothy 3+    10. Ragweed Mix 3+    11. Cocklebur 2+    12. Plantain,  English 3+    13. Baccharis 3+    14. Dog Fennel Negative    15. Russian Thistle 3+    16. Lamb's Quarters 3+    17. Sheep Sorrell Negative    18. Rough Pigweed 3+    19. Marsh Elder, Rough Negative    20. Mugwort, Common 3+    21. Box, Elder 2+    22. Cedar, red Negative    23. Sweet Gum 3+    24. Pecan Pollen 3+    25. Pine Mix Negative    26. Walnut, Black Pollen 3+    27. Red Mulberry 2+    28. Ash  Mix Negative    29. Birch Mix 3+    30. Beech American 3+    31. Cottonwood, [place] Negative    32. Hickory, White 3+    33. Maple Mix Negative    34. Oak, [place] Mix 3+    35. Sycamore Eastern Negative    36. Alternaria Alternata Negative    37. Cladosporium Herbarum Negative    38. Aspergillus Mix 2+    39. Penicillium Mix Negative    40. Bipolaris Sorokiniana (Helminthosporium) 2+    41. Drechslera Spicifera (Curvularia) Negative    42. Mucor Plumbeus Negative    43. Fusarium Moniliforme Negative    44. Aureobasidium Pullulans (pullulara) 3+    45. Rhizopus Oryzae Negative    46. Botrytis Cinera Negative    47. Epicoccum Nigrum Negative    48. Phoma Betae Negative    49. Dust Mite Mix 3+    50. Cat Hair 10,000 BAU/ml 3+    51.  Dog Epithelia 3+    52. Mixed Feathers 2+    53. Horse Epithelia 2+    54. Cockroach,  German 2+             Food Adult Perc - 11/14/23 1400     Time Antigen Placed 1425    Allergen Manufacturer Floyd Hutchinson    Location Back    Number of allergen test 9    Control-Histamine 3+    1. Peanut  --   25x7   8. Shellfish Mix --   6x7   23. Shrimp --   9x4   24. Crab Negative    25. Lobster --   11x3   26. Oyster Negative    27. Scallops Negative    38. Tomato Negative              Assessment:   1. Seasonal allergic rhinitis due to pollen   2. Allergic rhinitis due to insect   3. Allergic rhinitis due to dust mite   4. Allergic rhinitis due to animal hair or dander   5. Allergic rhinitis caused by mold   6. Anaphylactic shock due to shellfish, subsequent encounter   7. Peanut -induced anaphylaxis, subsequent encounter     Plan/Recommendations:  Allergic Rhinitis: - Due to turbinate hypertrophy, seasonal symptoms, asthma and unresponsive to over the counter meds, will perform skin testing to identify aeroallergen triggers.   - Positive skin test 10/2023: trees, grasses, weeds, molds, dust mites, cats, dogs, feathers, horses, cockraoch   - Avoidance measures discussed. - Use nasal saline rinses before nose sprays such as with Neilmed Sinus Rinse.  Use distilled water.   - Use Flonase  2 sprays each nostril daily. Aim upward and outward. - Use Azelastine 2 sprays each nostril twice daily as needed for runny nose, drainage, sneezing, congestion. Aim upward and outward. - Use Zyrtec 10 mg daily.  - Use Singulair  10mg  daily.  Stop if there are any mood/behavioral changes. - Consider allergy shots as long term control of your symptoms by teaching your immune system to be more tolerant of your allergy triggers    Mild Persistent Asthma: - Maintenance inhaler: start Flovent  110mcg 2 puffs twice daily with spacer. Continue Singulair  10mg  daily.  - Rescue inhaler: Albuterol  or Xopenex  2 puffs via spacer or 1 vial via nebulizer every 4-6 hours as needed for respiratory symptoms of cough, shortness of breath, or wheezing Asthma control goals:  Full participation in all desired activities (may need albuterol  before activity) Albuterol  use two times or less a week on average (not counting use with activity) Cough interfering with sleep two times or less a month Oral steroids no more than once a year No hospitalizations    Food Allergy:  - please strictly avoid shellfish, peanuts - Initial rxn- shellfish with throat closing/lip swelling/facial rash/warmth; peanuts with throat closing/facial rash;  - sIgE 10/2023 positive to shellfish, peanut  (with high risk)  - SPT 10/2023: positive to shellfish and peanut  - for SKIN only reaction, okay to take Benadryl  25mg  capsules every 6 hours as needed - for SKIN + ANY additional symptoms, OR IF concern for LIFE THREATENING reaction = Epipen  Autoinjector EpiPen  0.3 mg. - If using Epinephrine  autoinjector, call 911 or go to the ER.    Oral Allergy Syndrome- Tomato - Okay to eat tomato but can result in lip/mouth itching/swelling. If bothersome, avoid.   - Initial rxn: tomato with lip swelling  but many times eats it without any issues  - sIgE 10/2023 positive tomato; SPT 10/2023 negative tomato - These symptoms are typically not life-threatening and are because of a cross reaction between a pollen  you are allergic to, and to a protein in specific foods (such as fresh fruits, vegetables, and nuts). - If you can eat these things and tolerate the symptoms, it is fine to continue to do so.  If not, you may avoid these fresh fruits and vegetables.   - Heating these foods, buying them canned, and peeling these foods should allow them to be consumed without symptoms or with less symptoms. - Patients typically report itching and/or mild swelling of the mouth and throat immediately following ingestion of certain uncooked fruits (including nuts) or raw vegetables.  - Only a very small number of affected individuals experience systemic allergic reactions, such as anaphylaxis which occurs with true food allergies.       Return in about 2 months (around 01/14/2024).  Kristen Petri, MD [facility] 

## 2023-11-22 ENCOUNTER — Encounter (INDEPENDENT_AMBULATORY_CARE_PROVIDER_SITE_OTHER): Payer: Self-pay | Admitting: Internal Medicine

## 2023-11-22 ENCOUNTER — Ambulatory Visit (INDEPENDENT_AMBULATORY_CARE_PROVIDER_SITE_OTHER): Admitting: Internal Medicine

## 2023-11-22 VITALS — BP 120/77 | HR 65 | Temp 98.3°F | Ht 63.0 in | Wt 317.0 lb

## 2023-11-22 DIAGNOSIS — R0602 Shortness of breath: Secondary | ICD-10-CM | POA: Diagnosis not present

## 2023-11-22 DIAGNOSIS — Z1331 Encounter for screening for depression: Secondary | ICD-10-CM

## 2023-11-22 DIAGNOSIS — R7303 Prediabetes: Secondary | ICD-10-CM | POA: Diagnosis not present

## 2023-11-22 DIAGNOSIS — R948 Abnormal results of function studies of other organs and systems: Secondary | ICD-10-CM | POA: Insufficient documentation

## 2023-11-22 DIAGNOSIS — R29818 Other symptoms and signs involving the nervous system: Secondary | ICD-10-CM

## 2023-11-22 DIAGNOSIS — R5383 Other fatigue: Secondary | ICD-10-CM | POA: Diagnosis not present

## 2023-11-22 DIAGNOSIS — Z6841 Body Mass Index (BMI) 40.0 and over, adult: Secondary | ICD-10-CM | POA: Diagnosis not present

## 2023-11-22 DIAGNOSIS — E559 Vitamin D deficiency, unspecified: Secondary | ICD-10-CM

## 2023-11-22 DIAGNOSIS — E669 Obesity, unspecified: Secondary | ICD-10-CM | POA: Insufficient documentation

## 2023-11-22 DIAGNOSIS — E66813 Obesity, class 3: Secondary | ICD-10-CM

## 2023-11-22 NOTE — Assessment & Plan Note (Signed)
 Patient has a slower than predicted metabolism. IC 1493 vs. calculated 2100. This may contribute to weight gain, chronic fatigue and difficulty losing weight.   We reviewed measures to improve metabolism including not skipping meals, progressive strengthening exercises, increasing protein intake at every meal and maintaining adequate hydration and sleep.

## 2023-11-22 NOTE — Assessment & Plan Note (Signed)
 Vitamin D deficiency confirmed. Currently on weekly vitamin D supplementation. Discussed the role of vitamin D in fatigue, weight gain, and immune function, noting its deficiency can increase diabetes risk and affect immune health. - Continue vitamin D supplementation. - Recheck vitamin D levels in follow-up visit.

## 2023-11-22 NOTE — Progress Notes (Signed)
 [address] [place],  [place], [place] [address]  Office: [phone]  /  Fax: [phone]   Subjective   Initial Visit  Cassandra Foley (MR# [id number]) is an 28 y.o. female who presents for evaluation and treatment of obesity and related comorbidities. Current BMI is Body mass index is 56.15 kg/m. Kimore has been struggling with her weight for many years and has been unsuccessful in either losing weight, maintaining weight loss, or reaching her healthy weight goal.  Ashleyn is currently in the action stage of change and ready to dedicate time achieving and maintaining a healthier weight. Carrin is interested in becoming our patient and working on intensive lifestyle modifications including (but not limited to) diet and exercise for weight loss.   Weight history  Discussed the use of AI scribe software for clinical note transcription with the patient, who gave verbal consent to proceed.  History of Present Illness   BESAN MCKEOUGH is a 28 year old female who presents for an intake appointment for medical weight management.  She seeks medical weight management to improve her health and increase physical activity. She wants to ride roller coasters, fit comfortably in chairs, play more with her children and grandchildren, walk further, have more energy, and enjoy life more. She has considered weight loss surgery in the past.  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 She is single and has a seven-year-old daughter. Her family is supportive of her weight management efforts. She currently walks for 30 minutes a day, achieving about 2,000 to 3,000 steps daily. Her current weight is 318 pounds, with a desired weight of 180 to 200 pounds. She started gaining weight around 2019, attributing it to stress after childbirth and family history. Her heaviest weight has been 330 pounds. She identifies as a 'yo-yo dieter' and has not tried other weight loss programs in the past.  She eats  outside the home and consumes fast food about once a week. She does most of the grocery shopping and enjoys cooking, though she identifies obstacles such as using healthy ingredients and avoiding frying. She craves salty foods like [place] fries and noodles, especially when stressed or at night. She dislikes brown rice, avocado, and bland foods but does not consider herself picky. Her snacks include pickles, popcorn, fruit, peanut  butter, crackers, and muffins. She sometimes skips lunch once a week. Eating healthy has been financially challenging, but her family does not make it difficult.  She drinks a lot of regular soda daily, along with juice and tea with sugar, and sometimes smoothies with fruit and added sugars. She experiences excessive hunger and eats quickly at times. She tends to eat when stressed, sad, as a reward, or for comfort, and occasionally feels out of control when eating.  She gets about five hours of sleep per night and does not feel refreshed, often feeling tired. She reports snoring, waking up with headaches two to three times a week, and sometimes waking up gasping for air. Her Epworth Sleepiness Scale score is nine.  Her recent blood work from October 10, 2023, showed a TSH of 2.59, with a slightly low thyroid hormone level. Her cholesterol levels were good, with an LDL of 88, HDL of 59, and total cholesterol of 169. Her A1c was 6.0, indicating prediabetes. She is vitamin D deficient and takes vitamin D once a week. Tests for syphilis, hepatitis, and HIV were negative. Her blood counts were normal, with no anemia. An EKG showed no signs  of heart attack or heart enlargement, and her heart's electrical activity was normal. A metabolism test indicated a slow metabolism, burning only 1,483 calories instead of the expected 2,100 calories.       Past medical history includes:   Past Medical History:  Diagnosis Date   Allergies    Asthma    last used inhaler yesterday   Back pain     Back pain    Eczema    Hypothyroidism    Obesity    Prediabetes    Vitamin D deficiency      Objective   BP 120/77   Pulse 65   Temp 98.3 F (36.8 C)   Ht 5\' 3"  (1.6 m)   Wt (!) 317 lb (143.8 kg)   LMP 11/21/2023   SpO2 99%   BMI 56.15 kg/m  She was weighed on the bioimpedance scale: Body mass index is 56.15 kg/m.    Anthropometrics:  Vitals Temp: 98.3 F (36.8 C) BP: 120/77 Pulse Rate: 65 SpO2: 99 %   Anthropometric Measurements Height: 5\' 3"  (1.6 m) Weight: (!) 317 lb (143.8 kg) BMI (Calculated): 56.17 Starting Weight: 317 lb Waist Measurement : 57 inches   Body Composition  Body Fat %: 54.9 % Fat Mass (lbs): 174 lbs Muscle Mass (lbs): 135.8 lbs Visceral Fat Rating : 20   Other Clinical Data RMR: 1483 Fasting: yes Labs: yes Today's Visit #: 1 Starting Date: 11/22/23    Physical Exam:  General: She is overweight, cooperative, alert, well developed, and in no acute distress. PSYCH: Has normal mood, affect and thought process.   HEENT: EOMI, sclerae are anicteric. Lungs: Normal breathing effort, no conversational dyspnea. Extremities: No edema.  Neurologic: No gross sensory or motor deficits. No tremors or fasciculations noted.    Diagnostic Data Reviewed  EKG: Normal sinus rhythm, rate 62 bpm. No conduction abnormalities, abnormal Q waves or chamber enlargement.  Indirect Calorimeter completed today shows a VO2 of 215 and a REE of 1483.  Her calculated basal metabolic rate is 1610 thus her resting energy expenditure slower than calculated.  Depression Screen  Arlinda's PHQ-9 score was: 14.     06/03/2019    3:50 PM  Depression screen PHQ 2/9  Decreased Interest 0  Down, Depressed, Hopeless 0  PHQ - 2 Score 0    Screening for Sleep Related Breathing Disorders  Shimika admits to daytime somnolence and admits to waking up still tired. Patient has a history of symptoms of daytime fatigue and morning headache. Jodye generally gets 5  hours of sleep per night, and states that she does not sleep well most nights. Snoring is present. Apneic episodes is present. Epworth Sleepiness Score is 9.   BMET    Component Value Date/Time   NA 139 10/28/2008 1711   K 4.2 10/28/2008 1711   CL 105 10/28/2008 1711   GLUCOSE 89 10/28/2008 1711   BUN <3 (L) 10/28/2008 1711   CREATININE 0.7 10/28/2008 1711   No results found for: "HGBA1C" No results found for: "INSULIN" CBC    Component Value Date/Time   WBC 4.9 07/31/2016 0430   RBC 3.95 07/31/2016 0430   HGB 12.2 07/31/2016 0430   HGB 12.1 04/20/2016 1030   HCT 34.9 (L) 07/31/2016 0430   HCT 36.3 04/20/2016 1030   PLT 160 07/31/2016 0430   PLT 169 04/20/2016 1030   MCV 88.4 07/31/2016 0430   MCV 93 04/20/2016 1030   MCH 30.9 07/31/2016 0430   MCHC 35.0 07/31/2016 0430  RDW 14.1 07/31/2016 0430   RDW 14.9 04/20/2016 1030   Iron/TIBC/Ferritin/ %Sat No results found for: "IRON", "TIBC", "FERRITIN", "IRONPCTSAT" Lipid Panel  No results found for: "CHOL", "TRIG", "HDL", "CHOLHDL", "VLDL", "LDLCALC", "LDLDIRECT" Hepatic Function Panel  No results found for: "PROT", "ALBUMIN", "AST", "ALT", "ALKPHOS", "BILITOT", "BILIDIR", "IBILI" No results found for: "TSH"   Assessment and Plan   TREATMENT PLAN FOR OBESITY:  Recommended Dietary Goals  Marvalee is currently in the action stage of change. As such, her goal is to implement medically supervised weight loss plan.  She has agreed to implement: the Category 2 plan - 1200 kcal per day  Behavioral Intervention  We discussed the following Behavioral Modification Strategies today: increasing lean protein intake to established goals, decreasing simple carbohydrates , increasing vegetables, increasing lower glycemic fruits, increasing fiber rich foods, avoiding skipping meals, increasing water intake, work on meal planning and preparation, reading food labels , keeping healthy foods at home, identifying sources and decreasing  liquid calories, decreasing eating out or consumption of processed foods, and making healthy choices when eating convenient foods, planning for success, and better snacking choices  Additional resources provided today: Handout on healthy eating and balanced plate, Handout on complex carbohydrates and lean sources of protein, Category 2 packet, and principles of weight management  Recommended Physical Activity Goals  Halee has been advised to work up to 150 minutes of moderate intensity aerobic activity a week and strengthening exercises 2-3 times per week for cardiovascular health, weight loss maintenance and preservation of muscle mass.   She has agreed to :  Think about enjoyable ways to increase daily physical activity and overcoming barriers to exercise and Increase physical activity in their day and reduce sedentary time (increase NEAT).  Pharmacotherapy We will work on building a Therapist, art and behavioral strategies. We will discuss the role of pharmacotherapy as an adjunct at subsequent visits.   ASSOCIATED CONDITIONS ADDRESSED TODAY  Other Fatigue Kamiah admits to daytime somnolence and admits to waking up still tired. Patient has a history of symptoms of daytime fatigue and morning headache. Caeley generally gets 5 hours of sleep per night, and states that she does not sleep well most nights. Snoring is present. Apneic episodes is present. Epworth Sleepiness Score is 9. Trula Gable does feel that her weight is causing her energy to be lower than it should be. Fatigue may be related to obesity, depression or many other causes. Labs will be ordered, and in the meanwhile, Alianis will focus on self care including making healthy food choices, increasing physical activity and focusing on stress reduction.  Shortness of Breath Jarita notes increasing shortness of breath with exercising and seems to be worsening over time with weight gain. She notes getting out of breath  sooner with activity than she used to. This has not gotten worse recently. Zoejane denies shortness of breath at rest or orthopnea.Jinx notes increasing shortness of breath with exercising and seems to be worsening over time with weight gain. She notes getting out of breath sooner with activity than she used to. This has not gotten worse recently. Tykeira denies shortness of breath at rest or orthopnea.  Other fatigue -     EKG 12-Lead -     Vitamin B12 -     Comprehensive metabolic panel with GFR -     Insulin, random -     Hemoglobin A1c  SOB (shortness of breath) on exertion  Depression screen  Prediabetes Assessment & Plan: A1c of 6.0  indicates prediabetes. Emphasized weight loss and dietary changes to improve glycemic control, focusing on reducing simple sugars and carbohydrates. Losing approximately 10% of body weight can improve A1c levels. - Repeat A1c to monitor glycemic control. - Advise on dietary modifications to reduce simple sugars and carbohydrates. - Encourage weight loss through dietary changes and increased physical activity.  Orders: -     Vitamin B12 -     Comprehensive metabolic panel with GFR -     Insulin, random -     Hemoglobin A1c  Suspected sleep apnea Assessment & Plan: Reports of snoring, morning headaches, and gasping for air suggest possible sleep apnea. A sleep study is scheduled to confirm the diagnosis. Discussed the potential impact of untreated sleep apnea on metabolism and overall health. - Follow up on sleep study results scheduled for May 15th. - If diagnosed, initiate appropriate treatment for sleep apnea. - Losing 15% of body weight may improve condition   Class 3 severe obesity with serious comorbidity and body mass index (BMI) of 50.0 to 59.9 in adult, unspecified obesity type -     Vitamin B12 -     Comprehensive metabolic panel with GFR -     Insulin, random -     Hemoglobin A1c  Vitamin D deficiency Assessment & Plan: Vitamin D  deficiency confirmed. Currently on weekly vitamin D supplementation. Discussed the role of vitamin D in fatigue, weight gain, and immune function, noting its deficiency can increase diabetes risk and affect immune health. - Continue vitamin D supplementation. - Recheck vitamin D levels in follow-up visit.   Abnormal metabolism Assessment & Plan: Patient has a slower than predicted metabolism. IC 1493 vs. calculated 2100. This may contribute to weight gain, chronic fatigue and difficulty losing weight.   We reviewed measures to improve metabolism including not skipping meals, progressive strengthening exercises, increasing protein intake at every meal and maintaining adequate hydration and sleep.    Orders: -     T4, free -     TSH  Class 3 severe obesity with serious comorbidity and body mass index (BMI) of 50.0 to 59.9 in adult Assessment & Plan: Contributing factors: Inadequate sleep, abnormal metabolic rate, consumption of processed foods, low volume of physical activity, genetics, environment  See obesity treatment plan           Follow-up  She was informed of the importance of frequent follow-up visits to maximize her success with intensive lifestyle modifications for her multiple health conditions. She was informed we would discuss her lab results at her next visit unless there is a critical issue that needs to be addressed sooner. Nyala agreed to keep her next visit at the agreed upon time to discuss these results.  Attestation Statement  This is the patient's intake visit at [facility]. The patient's Health Questionnaire was reviewed at length. Included in the packet: current and past health history, medications, allergies, ROS, gynecologic history (women only), surgical history, family history, social history, weight history, weight loss surgery history (for those that have had weight loss surgery), nutritional evaluation, mood and food questionnaire,  PHQ9, Epworth questionnaire, sleep habits questionnaire, patient life and health improvement goals questionnaire. These will all be scanned into the patient's chart under media.   During the visit, I independently reviewed the patient's EKG, previous labs, bioimpedance scale results, and indirect calorimetry results. I used this information to medically tailor a meal plan for the patient that will help her to lose weight and will improve  her obesity-related conditions. I performed a medically necessary appropriate examination and/or evaluation. I discussed the assessment and treatment plan with the patient. The patient was provided an opportunity to ask questions and all were answered. The patient agreed with the plan and demonstrated an understanding of the instructions. Labs were ordered at this visit and will be reviewed at the next visit unless critical results need to be addressed immediately. Clinical information was updated and documented in the EMR.   In addition, they received basic education on identification of processed foods and reduction of these, different sources of lean proteins and complex carbohydrates and how to eat balanced by incorporation of whole foods.  Reviewed by clinician on day of visit: allergies, medications, problem list, medical history, surgical history, family history, social history, and previous encounter notes.  I have spent in the care of the patient today including: 9 minutes before the visit reviewing and prepping the chart 42 minutes face-to-face assessing and reviewing listed medical problems as outlined in obesity care plan, providing nutritional and behavioral counseling on topics outlined in the obesity care plan, independently interpreting test results and goals of care, as described in assessment and plan, reviewing and discussing biometric information and progress, and ordering diagnostics - see orders 9 minutes after the visit updating chart and  documentation    Ladd Picker, MD

## 2023-11-22 NOTE — Assessment & Plan Note (Signed)
 Contributing factors: Inadequate sleep, abnormal metabolic rate, consumption of processed foods, low volume of physical activity, genetics, environment  See obesity treatment plan

## 2023-11-22 NOTE — Assessment & Plan Note (Signed)
 Reports of snoring, morning headaches, and gasping for air suggest possible sleep apnea. A sleep study is scheduled to confirm the diagnosis. Discussed the potential impact of untreated sleep apnea on metabolism and overall health. - Follow up on sleep study results scheduled for May 15th. - If diagnosed, initiate appropriate treatment for sleep apnea. - Losing 15% of body weight may improve condition

## 2023-11-22 NOTE — Assessment & Plan Note (Signed)
 A1c of 6.0 indicates prediabetes. Emphasized weight loss and dietary changes to improve glycemic control, focusing on reducing simple sugars and carbohydrates. Losing approximately 10% of body weight can improve A1c levels. - Repeat A1c to monitor glycemic control. - Advise on dietary modifications to reduce simple sugars and carbohydrates. - Encourage weight loss through dietary changes and increased physical activity.

## 2023-11-25 ENCOUNTER — Other Ambulatory Visit (HOSPITAL_COMMUNITY): Payer: Self-pay

## 2023-11-25 MED ORDER — FLUTICASONE PROPIONATE 50 MCG/ACT NA SUSP
2.0000 | Freq: Every day | NASAL | 5 refills | Status: DC
  Filled 2023-11-25 – 2024-02-17 (×3): qty 16, 30d supply, fill #0
  Filled 2024-05-28: qty 16, 30d supply, fill #1

## 2023-11-25 MED ORDER — LEVALBUTEROL TARTRATE 45 MCG/ACT IN AERO
2.0000 | INHALATION_SPRAY | Freq: Four times a day (QID) | RESPIRATORY_TRACT | 1 refills | Status: DC | PRN
  Filled 2023-11-25 – 2024-02-17 (×2): qty 15, 30d supply, fill #0
  Filled 2024-04-22 – 2024-04-23 (×2): qty 15, 30d supply, fill #1

## 2023-11-25 MED ORDER — EPINEPHRINE 0.3 MG/0.3ML IJ SOAJ
INTRAMUSCULAR | 1 refills | Status: AC
  Filled 2023-11-25: qty 2, 30d supply, fill #0

## 2023-11-25 MED ORDER — MONTELUKAST SODIUM 10 MG PO TABS
10.0000 mg | ORAL_TABLET | Freq: Every day | ORAL | 1 refills | Status: DC
  Filled 2023-11-25 – 2024-02-17 (×3): qty 90, 90d supply, fill #0
  Filled 2024-05-28: qty 90, 90d supply, fill #1

## 2023-11-25 MED ORDER — CETIRIZINE HCL 10 MG PO TABS
10.0000 mg | ORAL_TABLET | Freq: Every day | ORAL | 5 refills | Status: DC
  Filled 2023-11-25 – 2024-02-17 (×3): qty 30, 30d supply, fill #0
  Filled 2024-04-22 – 2024-04-23 (×2): qty 30, 30d supply, fill #1
  Filled 2024-05-28: qty 30, 30d supply, fill #2

## 2023-11-25 MED ORDER — AZELASTINE HCL 0.1 % NA SOLN
2.0000 | Freq: Two times a day (BID) | NASAL | 5 refills | Status: AC | PRN
  Filled 2023-11-25: qty 30, 30d supply, fill #0
  Filled 2023-12-07: qty 30, 50d supply, fill #0

## 2023-11-25 MED ORDER — FLUTICASONE PROPIONATE HFA 110 MCG/ACT IN AERO
2.0000 | INHALATION_SPRAY | Freq: Two times a day (BID) | RESPIRATORY_TRACT | 5 refills | Status: DC
  Filled 2023-11-25 – 2023-12-07 (×2): qty 12, 30d supply, fill #0
  Filled 2024-02-17: qty 12, 30d supply, fill #1
  Filled 2024-04-22 – 2024-05-14 (×3): qty 12, 30d supply, fill #2
  Filled 2024-05-28 – 2024-06-19 (×2): qty 12, 30d supply, fill #3

## 2023-11-27 ENCOUNTER — Other Ambulatory Visit (HOSPITAL_COMMUNITY): Payer: Self-pay

## 2023-11-27 LAB — COMPREHENSIVE METABOLIC PANEL WITH GFR
ALT: 17 IU/L (ref 0–32)
AST: 14 IU/L (ref 0–40)
Albumin: 4.2 g/dL (ref 4.0–5.0)
Alkaline Phosphatase: 64 IU/L (ref 44–121)
BUN/Creatinine Ratio: 13 (ref 9–23)
BUN: 9 mg/dL (ref 6–20)
Bilirubin Total: <0.2 mg/dL (ref 0.0–1.2)
CO2: 22 mmol/L (ref 20–29)
Calcium: 8.9 mg/dL (ref 8.7–10.2)
Chloride: 104 mmol/L (ref 96–106)
Creatinine, Ser: 0.7 mg/dL (ref 0.57–1.00)
Globulin, Total: 2.7 g/dL (ref 1.5–4.5)
Glucose: 89 mg/dL (ref 70–99)
Potassium: 4.4 mmol/L (ref 3.5–5.2)
Sodium: 140 mmol/L (ref 134–144)
Total Protein: 6.9 g/dL (ref 6.0–8.5)
eGFR: 121 mL/min/{1.73_m2} (ref >59)

## 2023-11-27 LAB — VITAMIN B12: Vitamin B-12: 311 pg/mL (ref 232–1245)

## 2023-11-27 LAB — HEMOGLOBIN A1C
Est. average glucose Bld gHb Est-mCnc: 123 mg/dL
Hgb A1c MFr Bld: 5.9 % — ABNORMAL HIGH (ref 4.8–5.6)

## 2023-11-27 LAB — INSULIN, RANDOM: INSULIN: 18.1 u[IU]/mL (ref 2.6–24.9)

## 2023-11-30 ENCOUNTER — Ambulatory Visit: Admitting: Neurology

## 2023-11-30 ENCOUNTER — Encounter: Payer: Self-pay | Admitting: Neurology

## 2023-11-30 ENCOUNTER — Other Ambulatory Visit: Payer: Self-pay | Admitting: Neurology

## 2023-11-30 ENCOUNTER — Other Ambulatory Visit (HOSPITAL_COMMUNITY): Payer: Self-pay

## 2023-11-30 VITALS — BP 133/75 | HR 84 | Ht 63.0 in | Wt 318.0 lb

## 2023-11-30 DIAGNOSIS — E669 Obesity, unspecified: Secondary | ICD-10-CM | POA: Insufficient documentation

## 2023-11-30 DIAGNOSIS — R0683 Snoring: Secondary | ICD-10-CM | POA: Diagnosis not present

## 2023-11-30 DIAGNOSIS — G4709 Other insomnia: Secondary | ICD-10-CM

## 2023-11-30 DIAGNOSIS — Z72820 Sleep deprivation: Secondary | ICD-10-CM

## 2023-11-30 DIAGNOSIS — Z6841 Body Mass Index (BMI) 40.0 and over, adult: Secondary | ICD-10-CM

## 2023-11-30 DIAGNOSIS — E66813 Obesity, class 3: Secondary | ICD-10-CM

## 2023-11-30 DIAGNOSIS — G4726 Circadian rhythm sleep disorder, shift work type: Secondary | ICD-10-CM

## 2023-11-30 MED ORDER — ALPRAZOLAM 0.5 MG PO TABS
0.5000 mg | ORAL_TABLET | Freq: Every evening | ORAL | 0 refills | Status: DC | PRN
  Filled 2023-11-30 – 2023-12-07 (×2): qty 5, 5d supply, fill #0

## 2023-11-30 NOTE — Patient Instructions (Signed)
 Insomnia Insomnia is a sleep disorder that makes it difficult to fall asleep or stay asleep. Insomnia can cause fatigue, low energy, difficulty concentrating, mood swings, and poor performance at work or school. There are three different ways to classify insomnia: Difficulty falling asleep. Difficulty staying asleep. Waking up too early in the morning. Any type of insomnia can be long-term (chronic) or short-term (acute). Both are common. Short-term insomnia usually lasts for 3 months or less. Chronic insomnia occurs at least three times a week for longer than 3 months. What are the causes? Insomnia may be caused by another condition, situation, or substance, such as: Having certain mental health conditions, such as anxiety and depression. Using caffeine, alcohol, tobacco, or drugs. Having gastrointestinal conditions, such as gastroesophageal reflux disease (GERD). Having certain medical conditions. These include: Asthma. Alzheimer's disease. Stroke. Chronic pain. An overactive thyroid gland (hyperthyroidism). Other sleep disorders, such as restless legs syndrome and sleep apnea. Menopause. Sometimes, the cause of insomnia may not be known. What increases the risk? Risk factors for insomnia include: Gender. Females are affected more often than males. Age. Insomnia is more common as people get older. Stress and certain medical and mental health conditions. Lack of exercise. Having an irregular work schedule. This may include working night shifts and traveling between different time zones. What are the signs or symptoms? If you have insomnia, the main symptom is having trouble falling asleep or having trouble staying asleep. This may lead to other symptoms, such as: Feeling tired or having low energy. Feeling nervous about going to sleep. Not feeling rested in the morning. Having trouble concentrating. Feeling irritable, anxious, or depressed. How is this diagnosed? This  condition may be diagnosed based on: Your symptoms and medical history. Your health care provider may ask about: Your sleep habits. Any medical conditions you have. Your mental health. A physical exam. How is this treated? Treatment for insomnia depends on the cause. Treatment may focus on treating an underlying condition that is causing the insomnia. Treatment may also include: Medicines to help you sleep. Counseling or therapy. Lifestyle adjustments to help you sleep better. Follow these instructions at home: Eating and drinking  Limit or avoid alcohol, caffeinated beverages, and products that contain nicotine and tobacco, especially close to bedtime. These can disrupt your sleep. Do not eat a large meal or eat spicy foods right before bedtime. This can lead to digestive discomfort that can make it hard for you to sleep. Sleep habits  Keep a sleep diary to help you and your health care provider figure out what could be causing your insomnia. Write down: When you sleep. When you wake up during the night. How well you sleep and how rested you feel the next day. Any side effects of medicines you are taking. What you eat and drink. Make your bedroom a dark, comfortable place where it is easy to fall asleep. Put up shades or blackout curtains to block light from outside. Use a white noise machine to block noise. Keep the temperature cool. Limit screen use before bedtime. This includes: Not watching TV. Not using your smartphone, tablet, or computer. Stick to a routine that includes going to bed and waking up at the same times every day and night. This can help you fall asleep faster. Consider making a quiet activity, such as reading, part of your nighttime routine. Try to avoid taking naps during the day so that you sleep better at night. Get out of bed if you are still  awake after 15 minutes of trying to sleep. Keep the lights down, but try reading or doing a quiet activity. When you  feel sleepy, go back to bed. General instructions Take over-the-counter and prescription medicines only as told by your health care provider. Exercise regularly as told by your health care provider. However, avoid exercising in the hours right before bedtime. Use relaxation techniques to manage stress. Ask your health care provider to suggest some techniques that may work well for you. These may include: Breathing exercises. Routines to release muscle tension. Visualizing peaceful scenes. Make sure that you drive carefully. Do not drive if you feel very sleepy. Keep all follow-up visits. This is important. Contact a health care provider if: You are tired throughout the day. You have trouble in your daily routine due to sleepiness. You continue to have sleep problems, or your sleep problems get worse. Get help right away if: You have thoughts about hurting yourself or someone else. Get help right away if you feel like you may hurt yourself or others, or have thoughts about taking your own life. Go to your nearest emergency room or: Call 911. Call the National Suicide Prevention Lifeline at 574-262-3620 or 988. This is open 24 hours a day. Text the Crisis Text Line at 708-766-2399. Summary Insomnia is a sleep disorder that makes it difficult to fall asleep or stay asleep. Insomnia can be long-term (chronic) or short-term (acute). Treatment for insomnia depends on the cause. Treatment may focus on treating an underlying condition that is causing the insomnia. Keep a sleep diary to help you and your health care provider figure out what could be causing your insomnia. This information is not intended to replace advice given to you by your health care provider. Make sure you discuss any questions you have with your health care provider. Document Revised: 06/14/2021 Document Reviewed: 06/14/2021 Elsevier Patient Education  2024 [employer]vier [employer]. 28 y.o. year old female  here with:  Not getting enough  sleep, insomnia, early arousals, at risk for OSA     1) at risk for OSA and OHV- BMI 54, longstanding obesity .   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  Waking with a dull headache in AM, snoring has been witnessed. She has woken herself from snoring, choking.  Trouble to sleep langer than 3-4 AM and trouble to initiate sleep too.   Overall  OSA screening is needed to help with weight loss and daytime energy/   PLAN: HST, screening for  OSA. Aaron Aas Since there is a concern of sleep onset insomnia, I like to offer a sleep aid,facilitating a valid study sleep time  , 0.5 mg xanax .    I plan to follow up either personally or through our NP within 3-4 months.   I would like to thank Harry Lindau, FNP and Harry Lindau, Fnp [address] [place],  [place] [address] for allowing me to meet with and to take care of this pleasant patient.

## 2023-11-30 NOTE — Progress Notes (Signed)
 SLEEP [facility]    Provider:  Neomia Banner, MD  Primary Care Physician:  Harry Lindau, FNP [address] [place] [address]     Referring Provider: Harry Lindau, Fnp [address] [place],  [place] [address]   Dr Nathalie Baize, MD weight and wellness.          Chief Complaint according to patient   Patient presents with:     New Patient (Initial Visit)     Here for SLP consult. She is on a weight loss journey and they are wanting to evaluate if OSA. Wakes up with headaches. She avg 4-5 hrs of sleep but its broken and when she wakes up she is still tired. Never had a SS. Has woke up gasping for air.     Cassandra Foley 11/30/23 a right -handed female with a possible sleep disorder.    HISTORY OF PRESENT ILLNESS:  Cassandra Foley is a 28 y.o. female patient who is seen upon recommendation of her weight loss physician and referral by PCP on 11/30/2023 . She just started with medical weight loss and was told her sleep affects metabolism.  She feels fatigued. Not refreshed , not restored.  Chief concern according to patient :  " Fragmented sleep, but usual good sleep quality, she struggles with getting enough sleep- waking up undesirable early 3-4 AM and is then not able to go back. Some arousals related to snoring , sleep choking , dreams are  less frequent" .    Sleep relevant medical history: Nocturia 2-3 , no Tonsillectomy, frequent rhinitis , sinusitis,  Inhaler for SOB with bronchitis , asthma,    Family medical /sleep history: No other family member on CPAP with OSA, no DM , No HTN, MGM and MGP and maternal uncles had cancer.    Social history:  Patient is working as Control and instrumentation engineer and studies at [employer]  and lives in a household with her 73 year-old daughter.   The patient currently works days, but  used to work in shifts( Chief Technology Officer,) "  because I couldn't sleep anyway - 7 pm to 7 AM.   Tobacco use: none .  ETOH use ; 2/ months ,   Caffeine intake in form of Coffee( /) Soda( just quit last week ) Tea ( 1-2 cups of tea) no energy drinks Exercise in form of walking .   Hobbies : swimming, time with daughter, hiking.     Sleep habits are as follows: The patient's dinner time is between 5.30-6.30 PM.  Daughter will be with her mother if she works late.  The patient goes to bed at 11-12 PM and continues to sleep for 4-5 hours, wakes between 3-4 AM and sometimes she gets another 1-2 hours.   Bedroom is cool , quiet and dark.   The preferred sleep position is laterally, with the support of 2 pillows. Dreams are reportedly rare. The patient wakes up with an alarm.  6 AM is the usual rise time.  She reports not feeling refreshed or restored in AM, with symptoms such as dry mouth, morning headaches, and residual fatigue.  Naps are taken infrequently, she reports not being able to nap.  lasting from 30 to 60 minutes and are  less refreshing than nocturnal sleep.    Review of Systems: Out of a complete 14 system review, the patient complains of only the following symptoms, and all other reviewed systems are negative.:  Fatigue, sleepiness ,  snoring,  fragmented sleep, Insomnia,Nocturia    How likely are you to doze in the following situations: 0 = not likely, 1 = slight chance, 2 = moderate chance, 3 = high chance   Sitting and Reading? Watching Television? Sitting inactive in a public place (theater or meeting)? As a passenger in a car for an hour without a break? Lying down in the afternoon when circumstances permit? Sitting and talking to someone? Sitting quietly after lunch without alcohol? In a car, while stopped for a few minutes in traffic?   Total = 16/ 24 points   FSS endorsed at 49/ 63 points.   Social History   Socioeconomic History   Marital status: Single    Spouse name: Not on file   Number of children: Not on file   Years of education: Not on file   Highest education level: Not on file   Occupational History   Occupation: Consulting civil engineer, Psychologist, sport and exercise, nurse sec  Tobacco Use   Smoking status: Never    Passive exposure: Never   Smokeless tobacco: Never  Vaping Use   Vaping status: Never Used  Substance and Sexual Activity   Alcohol use: No   Drug use: No   Sexual activity: Not Currently    Partners: Male    Birth control/protection: Abstinence  Other Topics Concern   Not on file  Social History Narrative   ** Merged History Encounter **       Social Drivers of Corporate investment banker Strain: Not on file  Food Insecurity: Not on file  Transportation Needs: Not on file  Physical Activity: Not on file  Stress: Not on file  Social Connections: Not on file    Family History  Problem Relation Age of Onset   High blood pressure Mother    High Cholesterol Mother    Depression Mother    Alcoholism Mother    Asthma Father    Diabetes Father    High blood pressure Father    Depression Father    Sleep apnea Father    Alcoholism Father    Obesity Father    Asthma Brother    Cancer Maternal Grandmother    Cancer Maternal Grandfather    Diabetes Maternal Uncle    Cancer Maternal Uncle    Heart disease Neg Hx    Hypertension Neg Hx    Hearing loss Neg Hx    Stroke Neg Hx     Past Medical History:  Diagnosis Date   Allergies    Asthma    last used inhaler yesterday   Back pain    Back pain    Eczema    Hypothyroidism    Obesity    Prediabetes    Vitamin D deficiency     Past Surgical History:  Procedure Laterality Date   NO PAST SURGERIES       Current Outpatient Medications on File Prior to Visit  Medication Sig Dispense Refill   azelastine  (ASTELIN ) 0.1 % nasal spray Place 2 sprays into both nostrils 2 (two) times daily as needed. 30 mL 5   cetirizine  (ZYRTEC ) 10 MG tablet Take 1 tablet (10 mg total) by mouth daily. 30 tablet 5   diphenhydrAMINE  (BENADRYL ) 25 mg capsule Take 25 mg by mouth every 6 (six) hours as needed for allergies.      Emollient (DERMAPHOR EX) Apply 1 Application topically as needed.     EPINEPHrine  0.3 mg/0.3 mL IJ SOAJ injection Inject contents of pen as needed  for allergic reaction 2 each 1   fluticasone  (FLONASE ) 50 MCG/ACT nasal spray Place 2 sprays into both nostrils daily. 16 g 5   fluticasone  (FLOVENT  HFA) 110 MCG/ACT inhaler Inhale 2 puffs into the lungs in the morning and at bedtime. 12 g 5   ibuprofen  (ADVIL ,MOTRIN ) 600 MG tablet Take 1 tablet (600 mg total) by mouth every 6 (six) hours. 30 tablet 0   levalbuterol  (XOPENEX  HFA) 45 MCG/ACT inhaler Inhale 2 puffs into the lungs every 6 (six) hours as needed for shortness of breath or wheezing 15 g 1   metFORMIN (GLUCOPHAGE) 850 MG tablet Take 850 mg by mouth 2 (two) times daily.     montelukast  (SINGULAIR ) 10 MG tablet Take 1 tablet (10 mg total) by mouth daily. 90 tablet 1   Vitamin D, Ergocalciferol, (DRISDOL) 1.25 MG (50000 UNIT) CAPS capsule Take 50,000 Units by mouth once a week.     No current facility-administered medications on file prior to visit.    Allergies  Allergen Reactions   Peanut -Containing Drug Products Anaphylaxis   Shellfish Allergy  Anaphylaxis   Tomato Anaphylaxis     DIAGNOSTIC DATA (LABS, IMAGING, TESTING) - I reviewed patient records, labs, notes, testing and imaging myself where available.  Lab Results  Component Value Date   WBC 4.9 07/31/2016   HGB 12.2 07/31/2016   HCT 34.9 (L) 07/31/2016   MCV 88.4 07/31/2016   PLT 160 07/31/2016      Component Value Date/Time   NA 140 11/22/2023 0936   K 4.4 11/22/2023 0936   CL 104 11/22/2023 0936   CO2 22 11/22/2023 0936   GLUCOSE 89 11/22/2023 0936   GLUCOSE 89 10/28/2008 1711   BUN 9 11/22/2023 0936   CREATININE 0.70 11/22/2023 0936   CALCIUM 8.9 11/22/2023 0936   PROT 6.9 11/22/2023 0936   ALBUMIN 4.2 11/22/2023 0936   AST 14 11/22/2023 0936   ALT 17 11/22/2023 0936   ALKPHOS 64 11/22/2023 0936   BILITOT <0.2 11/22/2023 0936   No results found for:  "CHOL", "HDL", "LDLCALC", "LDLDIRECT", "TRIG", "CHOLHDL" Lab Results  Component Value Date   HGBA1C 5.9 (H) 11/22/2023   Lab Results  Component Value Date   VITAMINB12 311 11/22/2023   No results found for: "TSH"  PHYSICAL EXAM:  Today's Vitals   11/30/23 1528  BP: 133/75  Pulse: 84  Weight: (!) 318 lb (144.2 kg)  Height: 5\' 3"  (1.6 m)   Body mass index is 56.33 kg/m.   Wt Readings from Last 3 Encounters:  11/30/23 (!) 318 lb (144.2 kg)  11/22/23 (!) 317 lb (143.8 kg)  10/25/23 (!) 318 lb 3.2 oz (144.3 kg)     Ht Readings from Last 3 Encounters:  11/30/23 5\' 3"  (1.6 m)  11/22/23 5\' 3"  (1.6 m)  10/25/23 5' 3.25" (1.607 m)     BMI : 56 .  Weight gain was pregnancy related and stress continued to influence her choices.   General: The patient is awake, alert and appears not in acute distress. The patient is well groomed. Head: Normocephalic, atraumatic. Neck is supple.   Mallampati: 3, plus  neck circumference:18 inches . Nasal airflow fully patent.  Retrognathia is not seen.  Dental status:  biological  Cardiovascular:  Regular rate and cardiac rhythm by pulse,  without distended neck veins. Respiratory: Lungs are clear to auscultation.  Skin:  Without evidence of ankle edema, or rash. Trunk: The patient's posture is erect.   NEUROLOGIC EXAM: The patient is awake and  alert, oriented to place and time.   Memory subjective described as intact.  Attention span & concentration ability appears normal.  Speech is fluent,  without  dysarthria, dysphonia or aphasia.  Mood and affect are appropriate.   Cranial nerves: no loss of smell or taste reported  Pupils are equal and briskly reactive to light. Extraocular movements in vertical and horizontal planes were intact and without nystagmus. No Diplopia. Visual fields by finger perimetry are intact. Hearing was intact to soft voice .    Facial sensation intact to fine touch.  Facial motor strength is symmetric and tongue  and uvula move midline.  Neck ROM : rotation, tilt and flexion extension were normal for age and shoulder shrug was symmetrical.    Motor exam:  Symmetric bulk, tone and ROM.   Normal tone without cog wheeling, symmetric grip strength .   Sensory:  Fine touch, vibration were tested  and  normal.  Proprioception tested in the upper extremities was normal.   Gait and station: Patient could rise unassisted from a seated position, walked without assistive device.  Stance is of  wider base  Deep tendon reflexes: in the  upper and lower extremities are symmetric and intact.      ASSESSMENT AND PLAN 28 y.o. year old female  here with:  Not getting enough sleep, insomnia, early arousals, at risk for OSA     1) at risk for OSA and OHV- BMI 54, longstanding obesity .   S history of shift work , until 124 months ago, at the [facility].  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  Waking with a dull headache in AM, snoring has been witnessed. She has woken herself from snoring, choking.  Trouble to sleep langer than 3-4 AM and trouble to initiate sleep too.   Overall  OSA screening is needed to help with weight loss and daytime energy/   PLAN: HST, screening for  OSA. Aaron Aas Since there is a concern of sleep onset insomnia, I like to offer a sleep aid,facilitating a valid study sleep time  , 0.5 mg xanax .    I plan to follow up either personally or through our NP within 3-4 months.   I would like to thank Harry Lindau, FNP and Harry Lindau, Fnp [address] [place],  [place] [address] for allowing me to meet with and to take care of this pleasant patient.    After spending a total time of  45  minutes face to face and additional time for physical and neurologic examination, review of laboratory studies,  personal review of imaging studies, reports and  results of other testing and review of referral information / records as far as provided in visit,   Electronically signed by: Neomia Banner, MD 11/30/2023 3:45 PM  [facility] Board certified by The [employer] of Sleep Medicine and Diplomate of the [employer] of Sleep Medicine. Board certified In Neurology through the ABPN, Fellow of the [employer] of Neurology.

## 2023-12-07 ENCOUNTER — Other Ambulatory Visit (HOSPITAL_COMMUNITY): Payer: Self-pay

## 2023-12-07 ENCOUNTER — Other Ambulatory Visit: Payer: Self-pay

## 2023-12-07 MED ORDER — ERGOCALCIFEROL 1.25 MG (50000 UT) PO CAPS
50000.0000 [IU] | ORAL_CAPSULE | ORAL | 1 refills | Status: AC
  Filled 2023-12-07 – 2024-02-17 (×2): qty 12, 84d supply, fill #0
  Filled 2024-05-28: qty 4, 28d supply, fill #1

## 2023-12-07 MED ORDER — METFORMIN HCL 850 MG PO TABS
850.0000 mg | ORAL_TABLET | Freq: Two times a day (BID) | ORAL | 1 refills | Status: DC
Start: 2023-10-12 — End: 2023-12-13
  Filled 2023-12-07 – 2023-12-08 (×3): qty 180, 90d supply, fill #0

## 2023-12-08 ENCOUNTER — Other Ambulatory Visit (HOSPITAL_COMMUNITY): Payer: Self-pay

## 2023-12-08 ENCOUNTER — Other Ambulatory Visit: Payer: Self-pay

## 2023-12-13 ENCOUNTER — Ambulatory Visit (INDEPENDENT_AMBULATORY_CARE_PROVIDER_SITE_OTHER): Admitting: Internal Medicine

## 2023-12-13 ENCOUNTER — Other Ambulatory Visit (HOSPITAL_COMMUNITY): Payer: Self-pay

## 2023-12-13 ENCOUNTER — Other Ambulatory Visit: Payer: Self-pay

## 2023-12-13 ENCOUNTER — Encounter (INDEPENDENT_AMBULATORY_CARE_PROVIDER_SITE_OTHER): Payer: Self-pay | Admitting: Internal Medicine

## 2023-12-13 VITALS — BP 135/79 | HR 83 | Temp 98.6°F | Ht 63.0 in | Wt 309.0 lb

## 2023-12-13 DIAGNOSIS — E66813 Obesity, class 3: Secondary | ICD-10-CM

## 2023-12-13 DIAGNOSIS — R948 Abnormal results of function studies of other organs and systems: Secondary | ICD-10-CM | POA: Diagnosis not present

## 2023-12-13 DIAGNOSIS — R7303 Prediabetes: Secondary | ICD-10-CM | POA: Diagnosis not present

## 2023-12-13 DIAGNOSIS — E559 Vitamin D deficiency, unspecified: Secondary | ICD-10-CM

## 2023-12-13 DIAGNOSIS — Z6841 Body Mass Index (BMI) 40.0 and over, adult: Secondary | ICD-10-CM

## 2023-12-13 DIAGNOSIS — R29818 Other symptoms and signs involving the nervous system: Secondary | ICD-10-CM

## 2023-12-13 MED ORDER — METFORMIN HCL ER 500 MG PO TB24
500.0000 mg | ORAL_TABLET | Freq: Two times a day (BID) | ORAL | 0 refills | Status: DC
  Filled 2023-12-13: qty 60, 30d supply, fill #0

## 2023-12-13 MED ORDER — MULTI-VITAMIN/MINERALS PO TABS: 1.0000 | ORAL_TABLET | Freq: Every day | ORAL | Status: AC

## 2023-12-13 NOTE — Assessment & Plan Note (Addendum)
 I reviewed most recent labs.  Her A1c is 5.9 [facility] at [facility] and was 6.0 she has a mildly elevated insulin  levels at 18.  She is currently on metformin for pharmacoprophylaxis but is experiencing some nausea so we will change to the extended release formula.  She has limited sodas and will continue to reduce simple and added sugars in her diet.  She is also increased physical activity

## 2023-12-13 NOTE — Assessment & Plan Note (Addendum)
 Cassandra Foley is actively managing obesity with a 1200 calorie meal plan and regular exercise, resulting in an eight-pound weight loss since the last visit. She consumes more whole foods, reduces soda intake, and maintains hydration. She exercises five days a week for 30 to 45 minutes. Her body fat percentage decreased from 55% to 53%, with no muscle mass loss, indicating primarily fat loss. - Continue 1200 calorie meal plan focusing on whole foods and adequate protein intake. - Encourage regular exercise, aiming for 150 minutes of aerobic activity per week and incorporating strength training 2-3 times per week. - Use MyNet Diary app for tracking calories and macronutrients. - Consider referral to a dietitian for further nutritional counseling if needed. - Discuss the use of protein smoothies as meal replacements, ensuring they are balanced with protein, vegetables, and low sugar content. - Start daily multivitamin with minerals due to calorie restriction.

## 2023-12-13 NOTE — Assessment & Plan Note (Signed)
 Patient has a slower than predicted metabolism. IC 1493 vs. calculated 2100. This may contribute to weight gain, chronic fatigue and difficulty losing weight.   We reviewed measures to improve metabolism including not skipping meals, progressive strengthening exercises, increasing protein intake at every meal and maintaining adequate hydration and sleep.

## 2023-12-13 NOTE — Progress Notes (Signed)
 Office: [phone]  /  Fax: [phone]  Weight Summary And Biometrics  Vitals Temp: 98.6 F (37 C) BP: 135/79 Pulse Rate: 83 SpO2: 100 %   Anthropometric Measurements Height: 5\' 3"  (1.6 m) Weight: (!) 309 lb (140.2 kg) BMI (Calculated): 54.75 Weight at Last Visit: 317lb Weight Lost Since Last Visit: 8lb Weight Gained Since Last Visit: 0lb Starting Weight: 317lb Total Weight Loss (lbs): 8 lb (3.629 kg) Waist Measurement : 57 inches   Body Composition  Body Fat %: 53.6 % Fat Mass (lbs): 165.8 lbs Muscle Mass (lbs): 136.2 lbs Total Body Water (lbs): 108.4 lbs Visceral Fat Rating : 19    RMR: 1483  Today's Visit #: 2  Starting Date: 11/22/23   Subjective   Chief Complaint: Obesity  Interval History  Cassandra Foley is a 28 year old female with obesity who presents for a post intake appointment for obesity management.  She has lost eight pounds since her last office visit. She follows a 1200 calorie meal plan approximately 50-60% of the time without tracking calories. Her dietary changes include eating more whole foods, reducing soda intake, getting the recommended amount of protein, maintaining adequate hydration, and occasionally skipping meals. She exercises five days a week for 30 to 45 minutes.  She experiences inadequate sleep and some stress. A sleep study is scheduled for June 3rd, which will be conducted at home. There is a suspected diagnosis of sleep apnea.  She has a history of prediabetes and insulin  resistance. Her A1c was previously recorded at 5.9, indicating prediabetes. She is currently on metformin but experiences nausea with the immediate release formulation. Her insulin  levels were noted to be elevated at 18.1, with a normal range being less than 7.  She has a history of vitamin D deficiency, but her current vitamin D levels are reported to be good.  Challenges affecting patient progress: none.    Pharmacotherapy for weight  management: She is currently taking Metformin (off label use for incretin effect and / or insulin  resistance and / or diabetes prevention) with adequate clinical response  and without side effects..   Assessment and Plan   Treatment Plan For Obesity:  Recommended Dietary Goals  [name] is currently in the action stage of change. As such, her goal is to continue weight management plan. She has agreed to: follow the Category 2 plan - 1200 kcal per day  Behavioral Health and Counseling  We discussed the following behavioral modification strategies today: continue to work on maintaining a reduced calorie state, getting the recommended amount of protein, incorporating whole foods, making healthy choices, staying well hydrated and practicing mindfulness when eating..  Additional education and resources provided today: Handout and personalized instruction on tracking and journaling using Apps and Handout on how to make protein smoothies  Recommended Physical Activity Goals  [name] has been advised to work up to 150 minutes of moderate intensity aerobic activity a week and strengthening exercises 2-3 times per week for cardiovascular health, weight loss maintenance and preservation of muscle mass.   She has agreed to :  Start strengthening exercises with a goal of 2-3 sessions a week   Pharmacotherapy  We discussed various medication options to help Layloni with her weight loss efforts and we both agreed to : Change metformin from immediate release to extended release.  Associated Conditions Impacted by Obesity Treatment  Prediabetes Assessment & Plan: I reviewed most recent labs.  Her A[facility]c is 5.9 [facility] at [facility] and was 6.0 she has  a mildly elevated insulin  levels at 18.  She is currently on metformin for pharmacoprophylaxis but is experiencing some nausea so we will change to the extended release formula.  She has limited sodas and will continue to reduce simple and added sugars in her  diet.  She is also increased physical activity  Orders: -     metFORMIN HCl ER; Take 1 tablet (500 mg total) by mouth 2 (two) times daily with a meal.  Dispense: 60 tablet; Refill: 0  Class 3 severe obesity with serious comorbidity and body mass index (BMI) of 50.0 to 59.9 in adult Assessment & Plan: [name] is actively managing obesity with a 1200 calorie meal plan and regular exercise, resulting in an eight-pound weight loss since the last visit. She consumes more whole foods, reduces soda intake, and maintains hydration. She exercises five days a week for 30 to 45 minutes. Her body fat percentage decreased from 55% to 53%, with no muscle mass loss, indicating primarily fat loss. - Continue 1200 calorie meal plan focusing on whole foods and adequate protein intake. - Encourage regular exercise, aiming for 150 minutes of aerobic activity per week and incorporating strength training 2-3 times per week. - Use MyNet Diary app for tracking calories and macronutrients. - Consider referral to a dietitian for further nutritional counseling if needed. - Discuss the use of protein smoothies as meal replacements, ensuring they are balanced with protein, vegetables, and low sugar content. - Start daily multivitamin with minerals due to calorie restriction.  Orders: -     Multi-Vitamin/Minerals; Take 1 tablet by mouth daily. -     metFORMIN HCl ER; Take 1 tablet (500 mg total) by mouth 2 (two) times daily with a meal.  Dispense: 60 tablet; Refill: 0  Vitamin D deficiency Assessment & Plan: She has vitamin D deficiency based on vitamin D levels from March in Care Everywhere from [facility].  She is on high-dose vitamin D recommend repeating vitamin D levels in July after completing 4 months of high-dose supplementation.  She will also start a multivitamin with minerals as she is beginning a reduced calorie nutrition plan.   Suspected sleep apnea Assessment & Plan: Reports of snoring, morning headaches, and  gasping for air suggest possible sleep apnea. A sleep study is scheduled to confirm the diagnosis. Discussed the potential impact of untreated sleep apnea on metabolism and overall health. - Follow up on sleep study results scheduled  - If diagnosed, initiate appropriate treatment for sleep apnea. - Losing 15% of body weight may improve condition   Abnormal metabolism Assessment & Plan: Patient has a slower than predicted metabolism. IC 1493 vs. calculated 2100. This may contribute to weight gain, chronic fatigue and difficulty losing weight.   We reviewed measures to improve metabolism including not skipping meals, progressive strengthening exercises, increasing protein intake at every meal and maintaining adequate hydration and sleep.             Objective   Physical Exam:  Blood pressure 135/79, pulse 83, temperature 98.6 F (37 C), height 5\' 3"  (1.6 m), weight (!) 309 lb (140.2 kg), last menstrual period 11/21/2023, SpO2 100%, currently breastfeeding. Body mass index is 54.74 kg/m.  General: She is overweight, cooperative, alert, well developed, and in no acute distress. PSYCH: Has normal mood, affect and thought process.   HEENT: EOMI, sclerae are anicteric. Lungs: Normal breathing effort, no conversational dyspnea. Extremities: No edema.  Neurologic: No gross sensory or motor deficits. No tremors or fasciculations noted.  Diagnostic Data Reviewed:  BMET    Component Value Date/Time   NA 140 11/22/2023 0936   K 4.4 11/22/2023 0936   CL 104 11/22/2023 0936   CO2 22 11/22/2023 0936   GLUCOSE 89 11/22/2023 0936   GLUCOSE 89 10/28/2008 1711   BUN 9 11/22/2023 0936   CREATININE 0.70 11/22/2023 0936   CALCIUM 8.9 11/22/2023 0936   Lab Results  Component Value Date   HGBA1C 5.9 (H) 11/22/2023   Lab Results  Component Value Date   INSULIN  18.1 11/22/2023   No results found for: "TSH" CBC    Component Value Date/Time   WBC 4.9 07/31/2016 0430   RBC 3.95  07/31/2016 0430   HGB 12.2 07/31/2016 0430   HGB 12.1 04/20/2016 1030   HCT 34.9 (L) 07/31/2016 0430   HCT 36.3 04/20/2016 1030   PLT 160 07/31/2016 0430   PLT 169 04/20/2016 1030   MCV 88.4 07/31/2016 0430   MCV 93 04/20/2016 1030   MCH 30.9 07/31/2016 0430   MCHC 35.0 07/31/2016 0430   RDW 14.1 07/31/2016 0430   RDW 14.9 04/20/2016 1030   Iron Studies No results found for: "IRON", "TIBC", "FERRITIN", "IRONPCTSAT" Lipid Panel  No results found for: "CHOL", "TRIG", "HDL", "CHOLHDL", "VLDL", "LDLCALC", "LDLDIRECT" Hepatic Function Panel     Component Value Date/Time   PROT 6.9 11/22/2023 0936   ALBUMIN 4.2 11/22/2023 0936   AST 14 11/22/2023 0936   ALT 17 11/22/2023 0936   ALKPHOS 64 11/22/2023 0936   BILITOT <0.2 11/22/2023 0936   No results found for: "TSH" Nutritional No results found for: "VD25OH"  Medications: Outpatient Encounter Medications as of 12/13/2023  Medication Sig Note   ALPRAZolam  (XANAX ) 0.5 MG tablet Take 1 tablet (0.5 mg total) by mouth at bedtime as needed for anxiety or sleep (for night of sleep study, take 1 tab before bedtime and may repeat at bedtime).    azelastine  (ASTELIN ) 0.1 % nasal spray Place 2 sprays into both nostrils 2 (two) times daily as needed.    cetirizine  (ZYRTEC ) 10 MG tablet Take 1 tablet (10 mg total) by mouth daily.    diphenhydrAMINE  (BENADRYL ) 25 mg capsule Take 25 mg by mouth every 6 (six) hours as needed for allergies.    Emollient (DERMAPHOR EX) Apply 1 Application topically as needed.    EPINEPHrine  0.3 mg/0.3 mL IJ SOAJ injection Inject contents of pen as needed for allergic reaction    ergocalciferol (VITAMIN D2) 1.25 MG (50000 UT) capsule Take 1 capsule (50,000 Units total) by mouth once a week.    fluticasone  (FLONASE ) 50 MCG/ACT nasal spray Place 2 sprays into both nostrils daily.    fluticasone  (FLOVENT  HFA) 110 MCG/ACT inhaler Inhale 2 puffs into the lungs in the morning and at bedtime.    ibuprofen  (ADVIL ,MOTRIN ) 600  MG tablet Take 1 tablet (600 mg total) by mouth every 6 (six) hours.    levalbuterol  (XOPENEX  HFA) 45 MCG/ACT inhaler Inhale 2 puffs into the lungs every 6 (six) hours as needed for shortness of breath or wheezing    metFORMIN (GLUCOPHAGE-XR) 500 MG 24 hr tablet Take 1 tablet (500 mg total) by mouth 2 (two) times daily with a meal.    montelukast  (SINGULAIR ) 10 MG tablet Take 1 tablet (10 mg total) by mouth daily.    Multiple Vitamins-Minerals (MULTIVITAMIN WITH MINERALS) tablet Take 1 tablet by mouth daily.    Vitamin D, Ergocalciferol, (DRISDOL) 1.25 MG (50000 UNIT) CAPS capsule Take 50,000 Units by mouth once  a week.    [DISCONTINUED] metFORMIN  (GLUCOPHAGE ) 850 MG tablet Take 850 mg by mouth 2 (two) times daily. 12/13/2023: nausea   [DISCONTINUED] metFORMIN  (GLUCOPHAGE ) 850 MG tablet Take 1 tablet (850 mg total) by mouth 2 (two) times daily with meals 12/13/2023: nausea   No facility-administered encounter medications on file as of 12/13/2023.     Follow-Up   Return in about 3 weeks (around 01/03/2024) for For Weight Mangement with Dr. Allie Area.Aaron Aas She was informed of the importance of frequent follow up visits to maximize her success with intensive lifestyle modifications for her multiple health conditions.  Attestation Statement   Reviewed by clinician on day of visit: allergies, medications, problem list, medical history, surgical history, family history, social history, and previous encounter notes.   I have spent 45 minutes in the care of the patient today including: 3 minutes before the visit reviewing and preparing the chart. 35 minutes face-to-face assessing and reviewing listed medical problems as outlined in obesity care plan, providing nutritional and behavioral counseling on topics outlined in the obesity care plan, independently interpreting test results and goals of care, as described in assessment and plan, reviewing and discussing biometric information and progress, ordering  medications - see orders, and reviewing test results in Care Everywhere and going over recent test results. 7 minutes after the visit updating chart and documentation of encounter.    Ladd Picker, MD

## 2023-12-13 NOTE — Assessment & Plan Note (Signed)
 Reports of snoring, morning headaches, and gasping for air suggest possible sleep apnea. A sleep study is scheduled to confirm the diagnosis. Discussed the potential impact of untreated sleep apnea on metabolism and overall health. - Follow up on sleep study results scheduled  - If diagnosed, initiate appropriate treatment for sleep apnea. - Losing 15% of body weight may improve condition

## 2023-12-13 NOTE — Assessment & Plan Note (Signed)
 She has vitamin D deficiency based on vitamin D levels from March in Care Everywhere from [facility].  She is on high-dose vitamin D recommend repeating vitamin D levels in July after completing 4 months of high-dose supplementation.  She will also start a multivitamin with minerals as she is beginning a reduced calorie nutrition plan.

## 2023-12-15 ENCOUNTER — Other Ambulatory Visit (HOSPITAL_COMMUNITY): Payer: Self-pay

## 2023-12-19 ENCOUNTER — Ambulatory Visit (INDEPENDENT_AMBULATORY_CARE_PROVIDER_SITE_OTHER): Admitting: Neurology

## 2023-12-19 DIAGNOSIS — Z72820 Sleep deprivation: Secondary | ICD-10-CM

## 2023-12-19 DIAGNOSIS — G4709 Other insomnia: Secondary | ICD-10-CM

## 2023-12-19 DIAGNOSIS — G4733 Obstructive sleep apnea (adult) (pediatric): Secondary | ICD-10-CM

## 2023-12-19 DIAGNOSIS — E66813 Obesity, class 3: Secondary | ICD-10-CM

## 2023-12-19 DIAGNOSIS — Z6841 Body Mass Index (BMI) 40.0 and over, adult: Secondary | ICD-10-CM

## 2023-12-19 DIAGNOSIS — G4726 Circadian rhythm sleep disorder, shift work type: Secondary | ICD-10-CM

## 2023-12-19 DIAGNOSIS — R0683 Snoring: Secondary | ICD-10-CM

## 2023-12-20 NOTE — Progress Notes (Signed)
 [facility] CARRERA KIESEL 28 year old female 28-Mar-1996   HOME SLEEP TEST REPORT ( by Watch PAT)   STUDY DATE:  12-19-2023    ORDERING CLINICIAN: Neomia Banner, MD  REFERRING CLINICIAN: Dr Allie Area   CLINICAL INFORMATION/HISTORY: Mrs. Cassandra Foley is currently followed by Dr[facility] Lanette Pipe [facility] and wakes up with headaches in the morning sleeps an average of 4 to 5 hours of a day wakes up gasping for air.  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  She endorsed fatigue and wakes up never refreshed and never restored.  Her current BMI exceeds 50.     Epworth sleepiness score: 16/ 24 points   FSS endorsed at 49/ 63 points.   BMI: 56 kg/m   Neck Circumference: Mallampati: 3, plus  neck circumference:18 inches .   FINDINGS:   Sleep Summary:   Total Recording Time (hours, min):   6 hours 52 minutes  Total Sleep Time (hours, min):     5 hours 55 minutes            Percent REM (%): 24.5%                                      Respiratory Indices:   Calculated pAHI (per AASM guideline):    25.2/h                    REM pAHI:      52/h                                           NREM pAHI:      17/h                        Positional AHI:    The patient slept 245 minutes in non-REM supine sleep position here the AHI was 25.7/h with the highest on her right side.  She slept 110 minutes supine and here the AHI was 24.2/h.  Snoring: Mean volume was 44 dB which is loud snoring was also present for two thirds of the total recorded sleep time.                                              Oxygen Saturation Statistics:   Oxygen Saturation (%) Mean:       95%         O2 Saturation Range (%):      Between a nadir at 85 and a maximal saturation of 100%                                 O2 Saturation (minutes) <89%:   Less than 1 minute        Pulse Rate Statistics:   Pulse Mean  (bpm):    83 bpm             Pulse Range: Between 68 and 101 bpm, a more detailed  review revealed that the initially reported tachycardic events were artifact.                 IMPRESSION:  This HST confirms the presence of moderate severe obstructive sleep apnea without any central apneas being present.  The AHI of 25/h would qualify under the current FDA approved guidelines for Zepbound.  This patient also has a very strongly REM sleep dependent form of sleep apnea- treatment with a dental device or a hypoglossal nerve stimulator will not treat this form.   RECOMMENDATION:  I strongly urged her to use positive airway pressure therapy until a significant weight loss can be achieved.   At that time we will retest for her need of therapy-the guidelines state that at 20% weight loss should be followed by a new sleep study. .  In the meantime I will order an auto titration CPAP device with a setting of 7 through 17 cm water pressure 2 cm EPR, heated humidification and a mask of her choice and comfort.    INTERPRETING PHYSICIAN:   Neomia Banner, MD  [facility] Sleep Board certified by The [employer] of Sleep Medicine and Diplomate of the [employer] of Sleep Medicine. Board certified In Neurology through the ABPN, Fellow of the [employer] of Neurology.

## 2023-12-22 ENCOUNTER — Other Ambulatory Visit (HOSPITAL_COMMUNITY): Payer: Self-pay

## 2023-12-26 NOTE — Progress Notes (Deleted)
 Follow Up Note  RE: Cassandra Foley MRN: [id number] DOB: 11/20/1995 Date of Office Visit: 12/27/2023  Referring provider: Harry Lindau, FNP Primary care provider: Harry Lindau, FNP  Chief Complaint: No chief complaint on file.  History of Present Illness: I had the pleasure of seeing Cassandra Foley for a follow up visit at the [facility] on 12/27/2023. She is a 28 y.o. female, who is being followed for allergic rhinitis, asthma, food allergy  and oral allergy  syndrome. Her previous allergy  office visit was on 11/14/2023 with Dr. Lydia Sams. Today is a regular follow up visit.  Discussed the use of AI scribe software for clinical note transcription with the patient, who gave verbal consent to proceed.  History of Present Illness            ***  Assessment and Plan: Cassandra Foley is a 28 y.o. female with: Allergic Rhinitis: - Due to turbinate hypertrophy, seasonal symptoms, asthma and unresponsive to over the counter meds, will perform skin testing to identify aeroallergen triggers.   - Positive skin test 10/2023: trees, grasses, weeds, molds, dust mites, cats, dogs, feathers, horses, cockraoch  - Avoidance measures discussed. - Use nasal saline rinses before nose sprays such as with Neilmed Sinus Rinse.  Use distilled water.   - Use Flonase  2 sprays each nostril daily. Aim upward and outward. - Use Azelastine  2 sprays each nostril twice daily as needed for runny nose, drainage, sneezing, congestion. Aim upward and outward. - Use Zyrtec  10 mg daily.  - Use Singulair  10mg  daily.  Stop if there are any mood/behavioral changes. - Consider allergy  shots as long term control of your symptoms by teaching your immune system to be more tolerant of your allergy  triggers     Mild Persistent Asthma: - Maintenance inhaler: start Flovent  110mcg 2 puffs twice daily with spacer. Continue Singulair  10mg  daily.  - Rescue inhaler: Albuterol  or Xopenex  2 puffs via spacer or 1 vial  via nebulizer every 4-6 hours as needed for respiratory symptoms of cough, shortness of breath, or wheezing Asthma control goals:  Full participation in all desired activities (may need albuterol  before activity) Albuterol  use two times or less a week on average (not counting use with activity) Cough interfering with sleep two times or less a month Oral steroids no more than once a year No hospitalizations     Food Allergy :  - please strictly avoid shellfish, peanuts - Initial rxn- shellfish with throat closing/lip swelling/facial rash/warmth; peanuts with throat closing/facial rash;  - sIgE 10/2023 positive to shellfish, peanut  (with high risk)  - SPT 10/2023: positive to shellfish and peanut  - for SKIN only reaction, okay to take Benadryl  25mg  capsules every 6 hours as needed - for SKIN + ANY additional symptoms, OR IF concern for LIFE THREATENING reaction = Epipen  Autoinjector EpiPen  0.3 mg. - If using Epinephrine  autoinjector, call 911 or go to the ER.      Oral Allergy  Syndrome- Tomato - Okay to eat tomato but can result in lip/mouth itching/swelling. If bothersome, avoid.   - Initial rxn: tomato with lip swelling but many times eats it without any issues  - sIgE 10/2023 positive tomato; SPT 10/2023 negative tomato - These symptoms are typically not life-threatening and are because of a cross reaction between a pollen you are allergic to, and to a protein in specific foods (such as fresh fruits, vegetables, and nuts). - If you can eat these things and tolerate the symptoms, it is fine to  continue to do so.  If not, you may avoid these fresh fruits and vegetables.   - Heating these foods, buying them canned, and peeling these foods should allow them to be consumed without symptoms or with less symptoms. - Patients typically report itching and/or mild swelling of the mouth and throat immediately following ingestion of certain uncooked fruits (including nuts) or raw vegetables.  - Only a  very small number of affected individuals experience systemic allergic reactions, such as anaphylaxis which occurs with true food allergies.  Assessment and Plan              No follow-ups on file.  No orders of the defined types were placed in this encounter.  Lab Orders  No laboratory test(s) ordered today    Diagnostics: Spirometry:  Tracings reviewed. Her effort: {Blank single:19197::"Good reproducible efforts.","It was hard to get consistent efforts and there is a question as to whether this reflects a maximal maneuver.","Poor effort, data can not be interpreted."} FVC: ***L FEV1: ***L, ***% predicted FEV1/FVC ratio: ***% Interpretation: {Blank single:19197::"Spirometry consistent with mild obstructive disease","Spirometry consistent with moderate obstructive disease","Spirometry consistent with severe obstructive disease","Spirometry consistent with possible restrictive disease","Spirometry consistent with mixed obstructive and restrictive disease","Spirometry uninterpretable due to technique","Spirometry consistent with normal pattern","No overt abnormalities noted given today's efforts"}.  Please see scanned spirometry results for details.  Skin Testing: {Blank single:19197::"Select foods","Environmental allergy  panel","Environmental allergy  panel and select foods","Food allergy  panel","None","Deferred due to recent antihistamines use"}. *** Results discussed with patient/family.   Medication List:  Current Outpatient Medications  Medication Sig Dispense Refill  . ALPRAZolam  (XANAX ) 0.5 MG tablet Take 1 tablet (0.5 mg total) by mouth at bedtime as needed for anxiety or sleep (for night of sleep study, take 1 tab before bedtime and may repeat at bedtime). 5 tablet 0  . azelastine  (ASTELIN ) 0.1 % nasal spray Place 2 sprays into both nostrils 2 (two) times daily as needed. 30 mL 5  . cetirizine  (ZYRTEC ) 10 MG tablet Take 1 tablet (10 mg total) by mouth daily. 30 tablet 5  .  diphenhydrAMINE  (BENADRYL ) 25 mg capsule Take 25 mg by mouth every 6 (six) hours as needed for allergies.    . Emollient (DERMAPHOR EX) Apply 1 Application topically as needed.    . EPINEPHrine  0.3 mg/0.3 mL IJ SOAJ injection Inject contents of pen as needed for allergic reaction 2 each 1  . ergocalciferol  (VITAMIN D2) 1.25 MG (50000 UT) capsule Take 1 capsule (50,000 Units total) by mouth once a week. 12 capsule 1  . fluticasone  (FLONASE ) 50 MCG/ACT nasal spray Place 2 sprays into both nostrils daily. 16 g 5  . fluticasone  (FLOVENT  HFA) 110 MCG/ACT inhaler Inhale 2 puffs into the lungs in the morning and at bedtime. 12 g 5  . ibuprofen  (ADVIL ,MOTRIN ) 600 MG tablet Take 1 tablet (600 mg total) by mouth every 6 (six) hours. 30 tablet 0  . levalbuterol  (XOPENEX  HFA) 45 MCG/ACT inhaler Inhale 2 puffs into the lungs every 6 (six) hours as needed for shortness of breath or wheezing 15 g 1  . metFORMIN  (GLUCOPHAGE -XR) 500 MG 24 hr tablet Take 1 tablet (500 mg total) by mouth 2 (two) times daily with a meal. 60 tablet 0  . montelukast  (SINGULAIR ) 10 MG tablet Take 1 tablet (10 mg total) by mouth daily. 90 tablet 1  . Multiple Vitamins-Minerals (MULTIVITAMIN WITH MINERALS) tablet Take 1 tablet by mouth daily.    . Vitamin D , Ergocalciferol , (DRISDOL ) 1.25 MG (50000 UNIT) CAPS capsule Take  50,000 Units by mouth once a week.     No current facility-administered medications for this visit.   Allergies: Allergies  Allergen Reactions  . Peanut -Containing Drug Products Anaphylaxis  . Shellfish Allergy  Anaphylaxis  . Tomato Anaphylaxis   I reviewed her past medical history, social history, family history, and environmental history and no significant changes have been reported from her previous visit.  Review of Systems  Constitutional:  Negative for appetite change, chills, fever and unexpected weight change.  HENT:  Negative for congestion and rhinorrhea.   Eyes:  Negative for itching.  Respiratory:   Negative for cough, chest tightness, shortness of breath and wheezing.   Cardiovascular:  Negative for chest pain.  Gastrointestinal:  Negative for abdominal pain.  Genitourinary:  Negative for difficulty urinating.  Skin:  Negative for rash.  Allergic/Immunologic: Positive for environmental allergies and food allergies.  Neurological:  Negative for headaches.   Objective: There were no vitals taken for this visit. There is no height or weight on file to calculate BMI. Physical Exam Vitals and nursing note reviewed.  Constitutional:      Appearance: Normal appearance. She is well-developed.  HENT:     Head: Normocephalic and atraumatic.     Right Ear: Tympanic membrane and external ear normal.     Left Ear: Tympanic membrane and external ear normal.     Nose: Nose normal.     Mouth/Throat:     Mouth: Mucous membranes are moist.     Pharynx: Oropharynx is clear.  Eyes:     Conjunctiva/sclera: Conjunctivae normal.  Cardiovascular:     Rate and Rhythm: Normal rate and regular rhythm.     Heart sounds: Normal heart sounds. No murmur heard.    No friction rub. No gallop.  Pulmonary:     Effort: Pulmonary effort is normal.     Breath sounds: Normal breath sounds. No wheezing, rhonchi or rales.  Musculoskeletal:     Cervical back: Neck supple.  Skin:    General: Skin is warm.     Findings: No rash.  Neurological:     Mental Status: She is alert and oriented to person, place, and time.  Psychiatric:        Behavior: Behavior normal.  Previous notes and tests were reviewed. The plan was reviewed with the patient/family, and all questions/concerned were addressed.  It was my pleasure to see Cassandra Foley today and participate in her care. Please feel free to contact me with any questions or concerns.  Sincerely,  Eudelia Hero, DO Allergy  & Immunology  [facility]  [place] office: [phone] [facility] office: [phone]

## 2023-12-27 ENCOUNTER — Ambulatory Visit: Admitting: Allergy

## 2023-12-27 DIAGNOSIS — J3089 Other allergic rhinitis: Secondary | ICD-10-CM

## 2023-12-27 DIAGNOSIS — T781XXD Other adverse food reactions, not elsewhere classified, subsequent encounter: Secondary | ICD-10-CM

## 2023-12-27 DIAGNOSIS — J4530 Mild persistent asthma, uncomplicated: Secondary | ICD-10-CM

## 2023-12-27 DIAGNOSIS — J309 Allergic rhinitis, unspecified: Secondary | ICD-10-CM

## 2023-12-27 DIAGNOSIS — T7800XD Anaphylactic reaction due to unspecified food, subsequent encounter: Secondary | ICD-10-CM

## 2023-12-31 ENCOUNTER — Ambulatory Visit: Payer: Self-pay | Admitting: Neurology

## 2023-12-31 NOTE — Procedures (Signed)
 [facility] CLARITZA JULY 28 year old female 01/01/1996   HOME SLEEP TEST REPORT ( by [facility] PAT)   STUDY DATE:  12-19-2023    ORDERING CLINICIAN: Neomia Banner, MD  REFERRING CLINICIAN: Dr Allie Area   CLINICAL INFORMATION/HISTORY: Mrs. Cassandra Foley is currently followed by Dr. Lanette Pipe [facility] and wakes up with headaches in the morning sleeps an average of 4 to 5 hours of a day wakes up gasping for air.  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  She endorsed fatigue and wakes up never refreshed and never restored.  Her current BMI exceeds 50.     Epworth sleepiness score: 16/ 24 points   FSS endorsed at 49/ 63 points.   BMI: 56 kg/m   Neck Circumference: Mallampati: 3, plus  neck circumference:18 inches .   FINDINGS:   Sleep Summary:   Total Recording Time (hours, min):   6 hours 52 minutes  Total Sleep Time (hours, min):     5 hours 55 minutes            Percent REM (%): 24.5%                                      Respiratory Indices:   Calculated pAHI (per AASM guideline):    25.2/h                    REM pAHI:      52/h                                           NREM pAHI:      17/h                        Positional AHI:    The patient slept 245 minutes in non-REM supine sleep position here the AHI was 25.7/h with the highest on her right side.  She slept 110 minutes supine and here the AHI was 24.2/h.  Snoring: Mean volume was 44 dB which is loud snoring was also present for two thirds of the total recorded sleep time.                                              Oxygen Saturation Statistics:   Oxygen Saturation (%) Mean:       95%         O2 Saturation Range (%):      Between a nadir at 85 and a maximal saturation of 100%                                 O2 Saturation (minutes) <89%:   Less than 1 minute        Pulse Rate Statistics:   Pulse Mean (bpm):    83  bpm             Pulse Range: Between 68 and 101 bpm, a more detailed review revealed that the  initially reported tachycardic events were artifact.                 IMPRESSION:  This HST confirms the presence of moderate severe obstructive sleep apnea without any central apneas being present.  The AHI of 25/h would qualify under the current FDA approved guidelines for Zepbound.  This patient also has a very strongly REM sleep dependent form of sleep apnea- treatment with a dental device or a hypoglossal nerve stimulator will not treat this form.   RECOMMENDATION:  I strongly urged her to use positive airway pressure therapy until a significant weight loss can be achieved.   At that time we will retest for her need of therapy-the guidelines state that at 20% weight loss should be followed by a new sleep study. .  In the meantime I will order an auto titration CPAP device with a setting of 7 through 17 cm water pressure 2 cm EPR, heated humidification and a mask of her choice and comfort.    INTERPRETING PHYSICIAN:   Neomia Banner, MD  [facility] and [facility] Sleep Board certified by The [employer] of Sleep Medicine and Diplomate of the [employer] of [employer] Medicine. Board certified In Neurology through the ABPN, Fellow of the [employer] of Neu[employer].

## 2024-01-02 NOTE — Telephone Encounter (Signed)
-----   Message from [place] Dohmeier sent at 12/31/2023  9:03 PM EDT -----  I strongly urge this  young female patient to use positive airway pressure therapy until a significant weight loss can be achieved.   At that time we will retest for her need of therapy-the guidelines state that at 20% weight loss should be followed by a new sleep study.  In the meantime,  I will order an auto titration CPAP device with a setting of 7 through 17 cm water pressure 2 cm EPR, heated humidification and a mask of her choice and comfort.   ----- Message ----- From: Neomia Banner, MD Sent: 12/31/2023   9:00 PM EDT To: Neomia Banner, MD

## 2024-01-02 NOTE — Telephone Encounter (Signed)
 I called pt. I advised pt that Dr. Albertina Hugger reviewed their sleep study results and found that pt has sleep apnea. Dr. Albertina Hugger recommends that pt starts auto CPAP. I reviewed PAP compliance expectations with the pt. Pt is agreeable to starting a CPAP. I advised pt that an order will be sent to a DME, [employer] [facility], and [employer] [facility] will call the pt within about one week after they file with the pt's insurance. [employer] [facility] will show the pt how to use the machine, fit for masks, and troubleshoot the CPAP if needed. A follow up appt was made for insurance purposes with Jeanmarie Millet, NP on 02/20/2024 at 12:45 pm. Pt verbalized understanding to arrive 15 minutes early and bring their CPAP. Pt verbalized understanding of results. Pt had no questions at this time but was encouraged to call back if questions arise. I have sent the order to [employer] [facility] and have received confirmation that they have received the order.

## 2024-01-04 NOTE — Telephone Encounter (Signed)
 New, Wylie Heard, RN; New, Bradley; Cain, Mitchell; Ziegler, Melissa; Tucker, Dolanda; 1 other Received, thank you!

## 2024-01-09 ENCOUNTER — Other Ambulatory Visit (HOSPITAL_COMMUNITY): Payer: Self-pay

## 2024-01-09 ENCOUNTER — Ambulatory Visit (INDEPENDENT_AMBULATORY_CARE_PROVIDER_SITE_OTHER): Admitting: Internal Medicine

## 2024-01-09 ENCOUNTER — Encounter (INDEPENDENT_AMBULATORY_CARE_PROVIDER_SITE_OTHER): Payer: Self-pay | Admitting: Internal Medicine

## 2024-01-09 VITALS — BP 123/79 | HR 99 | Temp 98.6°F | Ht 63.0 in | Wt 314.0 lb

## 2024-01-09 DIAGNOSIS — Z6841 Body Mass Index (BMI) 40.0 and over, adult: Secondary | ICD-10-CM

## 2024-01-09 DIAGNOSIS — R948 Abnormal results of function studies of other organs and systems: Secondary | ICD-10-CM | POA: Diagnosis not present

## 2024-01-09 DIAGNOSIS — R7303 Prediabetes: Secondary | ICD-10-CM | POA: Diagnosis not present

## 2024-01-09 DIAGNOSIS — E66813 Obesity, class 3: Secondary | ICD-10-CM

## 2024-01-09 DIAGNOSIS — G4733 Obstructive sleep apnea (adult) (pediatric): Secondary | ICD-10-CM | POA: Insufficient documentation

## 2024-01-09 MED ORDER — METFORMIN HCL ER 500 MG PO TB24
500.0000 mg | ORAL_TABLET | Freq: Two times a day (BID) | ORAL | 0 refills | Status: DC
Start: 2024-01-09 — End: 2024-02-12
  Filled 2024-01-09: qty 60, 30d supply, fill #0

## 2024-01-09 NOTE — Progress Notes (Signed)
 Office: [phone]  /  Fax: [phone]  Weight Summary and Body Composition Analysis (BIA)  Vitals Temp: 98.6 F (37 C) BP: 123/79 Pulse Rate: 99 SpO2: 97 %   Anthropometric Measurements Height: 5' 3 (1.6 m) Weight: (!) 314 lb (142.4 kg) BMI (Calculated): 55.64 Weight at Last Visit: 309 lb Weight Lost Since Last Visit: 0 lb Weight Gained Since Last Visit: 5 lb Starting Weight: 317 lb Total Weight Loss (lbs): 3 lb (1.361 kg)   Body Composition  Body Fat %: 55.1 % Fat Mass (lbs): 173.2 lbs Muscle Mass (lbs): 134 lbs Visceral Fat Rating : 20    RMR: 1483  Today's Visit #: 3  Starting Date: 11/22/23   Subjective   Chief Complaint: Obesity  Interval History Discussed the use of AI scribe software for clinical note transcription with the patient, who gave verbal consent to proceed.  History of Present Illness   Cassandra Foley is a 28 year old female with obesity and prediabetes who presents for medical weight management.  Since her last visit, she has gained five pounds, which she attributes to a new schedule due to starting summer school. Her last class ends at 8:30 PM, affecting her eating habits. She sometimes skips breakfast and does not always pack lunch, leading to reliance on convenient foods. Despite these challenges, she attempts to eat more fruits and vegetables and has tried to incorporate brown rice with vegetables into her diet. She has not meal prepped for two weeks, which she feels contributed to her weight gain.  She follows a 1200 calorie nutrition plan approximately 70-80% of the time, tracking her calories, eating more whole foods, getting the recommended amount of protein, and maintaining hydration. However, she admits to skipping meals. She exercises five days a week for about 35 to 45 minutes, focusing on strength and cardio exercises. She uses the gym at work and has dumbbells at home.  She has prediabetes and is currently taking  metformin  twice a day. She is trying to manage her carbohydrate intake by choosing healthier options.  She is suspected to have sleep apnea, and a sleep study was conducted, showing an AHI of 25.2. She is in the process of following up on treatment.       Challenges affecting patient progress: work schedule, having difficulty preparing healthy meals, having difficulty with meal prep and planning, difficulty maintaining a reduced calorie state, and need for convenience or prepackaged foods.  Slow metabolic rate   Pharmacotherapy for weight management: She is currently taking Metformin  (off label use for incretin effect and / or insulin  resistance and / or diabetes prevention) with adequate clinical response  and without side effects..   Assessment and Plan   Treatment Plan For Obesity:  Recommended Dietary Goals  Cassandra Foley is currently in the action stage of change. As such, her goal is to continue weight management plan. She has agreed to: incorporate 1-2 meal replacements a day for convenience , continue current plan, and continue to work on implementation of reduced calorie nutrition plan (RCNP)  Behavioral Health and Counseling  We discussed the following behavioral modification strategies today: increasing lean protein intake to established goals, decreasing simple carbohydrates , increasing vegetables, increasing fiber rich foods, avoiding skipping meals, increasing water intake , work on meal planning and preparation, reading food labels , avoiding temptations and identifying enticing environmental cues, and better snacking choices.  Additional education and resources provided today: None  Recommended Physical Activity Goals  Cassandra Foley has been advised  to work up to 150 minutes of moderate intensity aerobic activity a week and strengthening exercises 2-3 times per week for cardiovascular health, weight loss maintenance and preservation of muscle mass.   She has agreed to :  continue to  gradually increase the amount and intensity of exercise routine  Medical Interventions and Pharmacotherapy  We discussed various medication options to help Cassandra Foley with her weight loss efforts and we both agreed to : Adequate clinical response to anti-obesity medication, continue current regimen.  She may not be a candidate for Zepbound as she has moderate to severe sleep apnea we will have to look into coverage but I would like for her to get a better grasp on nutritional strategies.  Associated Conditions Impacted by Obesity Treatment  OSA (obstructive sleep apnea) -moderate to severe, HST 11/2023, not started PAP therapy yet  Prediabetes -     metFORMIN  HCl ER; Take 1 tablet (500 mg total) by mouth 2 (two) times daily with a meal.  Dispense: 60 tablet; Refill: 0  Class 3 severe obesity with serious comorbidity and body mass index (BMI) of 50.0 to 59.9 in adult -     metFORMIN  HCl ER; Take 1 tablet (500 mg total) by mouth 2 (two) times daily with a meal.  Dispense: 60 tablet; Refill: 0  Abnormal metabolism     Assessment and Plan    Obesity /abnormal metabolism She has gained five pounds since the last visit, likely due to changes in her schedule with summer school affecting her meal planning and choices. She adheres to a 1200 calorie nutrition plan 70-80% of the time but acknowledges skipping meals and consuming convenient foods. She exercises five days a week for 35-45 minutes, focusing on strength and cardio. Discussed the treatment pyramid for weight management, emphasizing the importance of establishing a nutrition and exercise routine before considering medications or surgery. Medications like Qsymia can result in a 10% weight loss, while injections can lead to 15-22% weight loss, but are expensive and not covered by her insurance. Surgery is a last resort with good outcomes and low complication risks at [facility]. - Encourage meal prepping to avoid convenient high-calorie  foods. - Use meal replacements like protein shakes with fruit for breakfast or lunch. - Avoid high-carb snacks and focus on balanced meals. - Continue current exercise regimen.  Prediabetes Prediabetes is associated with her obesity. Explained the impact of high carbohydrate intake on insulin  production and metabolism, emphasizing the need to manage carbohydrate intake to prevent further metabolic issues. Insulin  production in response to high-carb intake can slow metabolism and promote fat storage. - Monitor carbohydrate intake and focus on whole foods. - Continue taking metformin  twice daily.  Sleep Apnea Confirmed moderate to severe sleep apnea with an AHI of 25.2. Discussed the impact of sleep apnea on weight and overall health, and the importance of CPAP therapy for management. CPAP therapy is essential for improving sleep quality and may qualify her for weight loss medications like Zepbound, which can result in 20-22% weight loss. Emphasized the need for compliance with CPAP , hopefully will start treatment soon. - Follow up with sleep study provider regarding CPAP therapy. - Use CPAP machine as prescribed to manage sleep apnea.  -Consider Zepbound in the future if covered by her insurance       Objective   Physical Exam:  Blood pressure 123/79, pulse 99, temperature 98.6 F (37 C), height 5' 3 (1.6 m), weight (!) 314 lb (142.4 kg), last menstrual period  01/07/2024, SpO2 97%, currently breastfeeding. Body mass index is 55.62 kg/m.  General: She is overweight, cooperative, alert, well developed, and in no acute distress. PSYCH: Has normal mood, affect and thought process.   HEENT: EOMI, sclerae are anicteric. Lungs: Normal breathing effort, no conversational dyspnea. Extremities: No edema.  Neurologic: No gross sensory or motor deficits. No tremors or fasciculations noted.    Diagnostic Data Reviewed:  BMET    Component Value Date/Time   NA 140 11/22/2023 0936   K 4.4  11/22/2023 0936   CL 104 11/22/2023 0936   CO2 22 11/22/2023 0936   GLUCOSE 89 11/22/2023 0936   GLUCOSE 89 10/28/2008 1711   BUN 9 11/22/2023 0936   CREATININE 0.70 11/22/2023 0936   CALCIUM 8.9 11/22/2023 0936   Lab Results  Component Value Date   HGBA1C 5.9 (H) 11/22/2023   Lab Results  Component Value Date   INSULIN  18.1 11/22/2023   No results found for: TSH CBC    Component Value Date/Time   WBC 4.9 07/31/2016 0430   RBC 3.95 07/31/2016 0430   HGB 12.2 07/31/2016 0430   HGB 12.1 04/20/2016 1030   HCT 34.9 (L) 07/31/2016 0430   HCT 36.3 04/20/2016 1030   PLT 160 07/31/2016 0430   PLT 169 04/20/2016 1030   MCV 88.4 07/31/2016 0430   MCV 93 04/20/2016 1030   MCH 30.9 07/31/2016 0430   MCHC 35.0 07/31/2016 0430   RDW 14.1 07/31/2016 0430   RDW 14.9 04/20/2016 1030   Iron Studies No results found for: IRON, TIBC, FERRITIN, IRONPCTSAT Lipid Panel  No results found for: CHOL, TRIG, HDL, CHOLHDL, VLDL, LDLCALC, LDLDIRECT Hepatic Function Panel     Component Value Date/Time   PROT 6.9 11/22/2023 0936   ALBUMIN 4.2 11/22/2023 0936   AST 14 11/22/2023 0936   ALT 17 11/22/2023 0936   ALKPHOS 64 11/22/2023 0936   BILITOT <0.2 11/22/2023 0936   No results found for: TSH Nutritional No results found for: VD25OH  Medications: Outpatient Encounter Medications as of 01/09/2024  Medication Sig   ALPRAZolam  (XANAX ) 0.5 MG tablet Take 1 tablet (0.5 mg total) by mouth at bedtime as needed for anxiety or sleep (for night of sleep study, take 1 tab before bedtime and may repeat at bedtime).   azelastine  (ASTELIN ) 0.1 % nasal spray Place 2 sprays into both nostrils 2 (two) times daily as needed.   cetirizine  (ZYRTEC ) 10 MG tablet Take 1 tablet (10 mg total) by mouth daily.   diphenhydrAMINE  (BENADRYL ) 25 mg capsule Take 25 mg by mouth every 6 (six) hours as needed for allergies.   EPINEPHrine  0.3 mg/0.3 mL IJ SOAJ injection Inject contents of pen  as needed for allergic reaction   ergocalciferol  (VITAMIN D2) 1.25 MG (50000 UT) capsule Take 1 capsule (50,000 Units total) by mouth once a week.   fluticasone  (FLONASE ) 50 MCG/ACT nasal spray Place 2 sprays into both nostrils daily.   fluticasone  (FLOVENT  HFA) 110 MCG/ACT inhaler Inhale 2 puffs into the lungs in the morning and at bedtime.   ibuprofen  (ADVIL ,MOTRIN ) 600 MG tablet Take 1 tablet (600 mg total) by mouth every 6 (six) hours.   levalbuterol  (XOPENEX  HFA) 45 MCG/ACT inhaler Inhale 2 puffs into the lungs every 6 (six) hours as needed for shortness of breath or wheezing   montelukast  (SINGULAIR ) 10 MG tablet Take 1 tablet (10 mg total) by mouth daily.   Multiple Vitamins-Minerals (MULTIVITAMIN WITH MINERALS) tablet Take 1 tablet by mouth daily.  Vitamin D , Ergocalciferol , (DRISDOL ) 1.25 MG (50000 UNIT) CAPS capsule Take 50,000 Units by mouth once a week.   [DISCONTINUED] metFORMIN  (GLUCOPHAGE -XR) 500 MG 24 hr tablet Take 1 tablet (500 mg total) by mouth 2 (two) times daily with a meal.   metFORMIN  (GLUCOPHAGE -XR) 500 MG 24 hr tablet Take 1 tablet (500 mg total) by mouth 2 (two) times daily with a meal.   No facility-administered encounter medications on file as of 01/09/2024.     Follow-Up   Return in about 3 weeks (around 01/30/2024) for For Weight Mangement with Dr. Francyne.SABRA She was informed of the importance of frequent follow up visits to maximize her success with intensive lifestyle modifications for her multiple health conditions.  Attestation Statement   Reviewed by clinician on day of visit: allergies, medications, problem list, medical history, surgical history, family history, social history, and previous encounter notes.     Lucas Francyne, MD

## 2024-01-22 ENCOUNTER — Ambulatory Visit (INDEPENDENT_AMBULATORY_CARE_PROVIDER_SITE_OTHER): Admitting: Internal Medicine

## 2024-01-23 ENCOUNTER — Other Ambulatory Visit (HOSPITAL_COMMUNITY): Payer: Self-pay

## 2024-02-02 ENCOUNTER — Encounter: Payer: Self-pay | Admitting: Advanced Practice Midwife

## 2024-02-12 ENCOUNTER — Other Ambulatory Visit (HOSPITAL_COMMUNITY): Payer: Self-pay

## 2024-02-12 ENCOUNTER — Ambulatory Visit (INDEPENDENT_AMBULATORY_CARE_PROVIDER_SITE_OTHER): Admitting: Internal Medicine

## 2024-02-12 ENCOUNTER — Encounter (INDEPENDENT_AMBULATORY_CARE_PROVIDER_SITE_OTHER): Payer: Self-pay | Admitting: Internal Medicine

## 2024-02-12 VITALS — BP 135/82 | HR 83 | Temp 98.4°F | Ht 63.0 in | Wt 307.0 lb

## 2024-02-12 DIAGNOSIS — Z6841 Body Mass Index (BMI) 40.0 and over, adult: Secondary | ICD-10-CM

## 2024-02-12 DIAGNOSIS — G4733 Obstructive sleep apnea (adult) (pediatric): Secondary | ICD-10-CM

## 2024-02-12 DIAGNOSIS — R948 Abnormal results of function studies of other organs and systems: Secondary | ICD-10-CM | POA: Diagnosis not present

## 2024-02-12 DIAGNOSIS — R7303 Prediabetes: Secondary | ICD-10-CM

## 2024-02-12 DIAGNOSIS — E66813 Obesity, class 3: Secondary | ICD-10-CM

## 2024-02-12 DIAGNOSIS — R29818 Other symptoms and signs involving the nervous system: Secondary | ICD-10-CM

## 2024-02-12 MED ORDER — METFORMIN HCL ER 500 MG PO TB24
500.0000 mg | ORAL_TABLET | Freq: Two times a day (BID) | ORAL | 0 refills | Status: DC
  Filled 2024-02-12 – 2024-04-23 (×5): qty 180, 90d supply, fill #0

## 2024-02-12 NOTE — Assessment & Plan Note (Signed)
 Reviewed sleep study, losing 10 to 15% of body weight may improve condition.  She may benefit from weight loss aided by GLP-1 in this case Zepbound.

## 2024-02-12 NOTE — Assessment & Plan Note (Signed)
 I reviewed most recent labs.  Her A1c is 5.9 previously at [facility] and was 6.0 she has a mildly elevated insulin  levels at 18.  She is currently on metformin  for pharmacoprophylaxis twice a day without any adverse effects.  She had side effects to immediate release in the past.  Continue current regimen

## 2024-02-12 NOTE — Progress Notes (Signed)
 Office: [phone]  /  Fax: [phone]  Weight Summary and Body Composition Analysis (BIA)  Vitals Temp: 98.4 F (36.9 C) BP: 135/82 Pulse Rate: 83 SpO2: 95 %   Anthropometric Measurements Height: 5' 3 (1.6 m) Weight: (!) 307 lb (139.3 kg) BMI (Calculated): 54.4 Weight at Last Visit: 314 lb Weight Lost Since Last Visit: 7 lb Weight Gained Since Last Visit: 0 lb Starting Weight: 317 lb Total Weight Loss (lbs): 10 lb (4.536 kg)   Body Composition  Body Fat %: 53.5 % Fat Mass (lbs): 164.6 lbs Muscle Mass (lbs): 135.8 lbs Total Body Water (lbs): 111.4 lbs Visceral Fat Rating : 19    RMR: 1483  Today's Visit #: 4  Starting Date: 11/22/23   Subjective   Chief Complaint: Obesity  Interval History Discussed the use of AI scribe software for clinical note transcription with the patient, who gave verbal consent to proceed.  History of Present Illness   Cassandra Foley is a 28 year old female who presents for medical weight management.  She has lost seven pounds since her last visit, 1200-calorie nutrition plan which includes more whole foods, adequate protein intake, and hydration. She occasionally skips meals, particularly breakfast, supplementing with shakes or core drinks. Physical activity has increased to exercising three days a week for sixty minutes each session.  She has experienced weight fluctuations, initially losing eight pounds, then gaining some back, and subsequently losing again.  She is currently on metformin , which she tolerates well without side effects, unlike a previous medication that caused nausea. No significant changes in appetite or cravings due to metformin . Her A1c is 5.9, and she has insulin  resistance.  Her diet is consistent, often having salmon and rice for lunch, which she finds filling. For dinner, she typically has a salad with meat. She occasionally experiences cravings for sweets, such as cookies, but manages these by not  indulging frequently.  She is motivated to improve her strength training routine and aims to establish a consistent schedule. She currently walks regularly and plans to incorporate more structured strength training exercises.  Her daughter is starting to notice and adopt healthier eating habits, which is important to her due to a genetic predisposition in her family.  She is out of school for the summer semester and plans to use this time to incorporate more consistent strength training into her routine.       Challenges affecting patient progress: none.    Pharmacotherapy for weight management: She is currently taking Metformin  (off label use for incretin effect and / or insulin  resistance and / or diabetes prevention) with adequate clinical response  and without side effects..   Assessment and Plan   Treatment Plan For Obesity:  Recommended Dietary Goals  Cassandra Foley is currently in the action stage of change. As such, her goal is to continue weight management plan. She has agreed to: continue current plan  Behavioral Health and Counseling  We discussed the following behavioral modification strategies today: continue to work on maintaining a reduced calorie state, getting the recommended amount of protein, incorporating whole foods, making healthy choices, staying well hydrated and practicing mindfulness when eating..  Additional education and resources provided today: None  Recommended Physical Activity Goals  Cassandra Foley has been advised to work up to 150 minutes of moderate intensity aerobic activity a week and strengthening exercises 2-3 times per week for cardiovascular health, weight loss maintenance and preservation of muscle mass.   She has agreed to :  continue to  gradually increase the amount and intensity of exercise routine  Medical Interventions and Pharmacotherapy  We discussed various medication options to help Cassandra Foley with her weight loss efforts and we both agreed to :  Adequate clinical response to anti-obesity medication, continue current regimen  Associated Conditions Impacted by Obesity Treatment  Assessment & Plan Prediabetes I reviewed most recent labs.  Her A1c is 5.9 [facility] at [facility] and was 6.0 she has a mildly elevated insulin  levels at 18.  She is currently on metformin  for pharmacoprophylaxis twice a day without any adverse effects.  She had side effects to immediate release in the past.  Continue current regimen Class 3 severe obesity with serious comorbidity and body mass index (BMI) of 50.0 to 59.9 in adult Cassandra Foley is undergoing medical weight management and has lost ten pounds, with a decrease in body fat percentage from 55% to 53% and visceral fat from 20 to 19. She adheres to consuming whole foods, maintaining hydration, and exercising three days a week for sixty minutes. Her caloric intake is 1200 calories due to a slow metabolism. She manages occasional cravings well and is advised to treat herself in moderation to maintain adherence. Discussed potential use of anti-obesity medication for additional support, emphasizing the importance of a strong foundation for sustainable results. - Continue 1200-calorie nutrition plan - Maintain exercise routine of three days a week for sixty minutes - Target caloric intake of 1200 calories - Manage cravings by treating in moderation - Consider [employer] for strengthening exercises - Continue metformin  for weight management and insulin  resistance - Discuss anti-obesity medication if additional support is needed Abnormal metabolism Patient has a slower than predicted metabolism. IC 1493 vs. calculated 2100. This may contribute to weight gain, chronic fatigue and difficulty losing weight.   We reviewed measures to improve metabolism including not skipping meals, progressive strengthening exercises, increasing protein intake at every meal and maintaining adequate hydration and sleep.   OSA  (obstructive sleep apnea) -moderate to severe, HST 11/2023, not started PAP therapy yet Reviewed sleep study, losing 10 to 15% of body weight may improve condition.  She may benefit from weight loss aided by GLP-1 in this case Zepbound.    General Health Maintenance Cassandra Foley is focused on improving her overall health and setting a positive example for her daughter. She makes conscious dietary choices and encourages her daughter to drink water and eat healthily. Guidance was provided on managing temptations and cravings, emphasizing moderation and avoiding labeling foods as good or bad. - Encourage healthy eating habits for herself and her daughter - Promote water intake over sugary drinks - Educate on the importance of moderation in diet        Objective   Physical Exam:  Blood pressure 135/82, pulse 83, temperature 98.4 F (36.9 C), height 5' 3 (1.6 m), weight (!) 307 lb (139.3 kg), last menstrual period 02/03/2024, SpO2 95%, currently breastfeeding. Body mass index is 54.38 kg/m.  General: She is overweight, cooperative, alert, well developed, and in no acute distress. PSYCH: Has normal mood, affect and thought process.   HEENT: EOMI, sclerae are anicteric. Lungs: Normal breathing effort, no conversational dyspnea. Extremities: No edema.  Neurologic: No gross sensory or motor deficits. No tremors or fasciculations noted.    Diagnostic Data Reviewed:  BMET    Component Value Date/Time   NA 140 11/22/2023 0936   K 4.4 11/22/2023 0936   CL 104 11/22/2023 0936   CO2 22 11/22/2023 0936   GLUCOSE 89 11/22/2023 0936  GLUCOSE 89 10/28/2008 1711   BUN 9 11/22/2023 0936   CREATININE 0.70 11/22/2023 0936   CALCIUM 8.9 11/22/2023 0936   Lab Results  Component Value Date   HGBA1C 5.9 (H) 11/22/2023   Lab Results  Component Value Date   INSULIN  18.1 11/22/2023   No results found for: TSH CBC    Component Value Date/Time   WBC 4.9 07/31/2016 0430   RBC 3.95 07/31/2016  0430   HGB 12.2 07/31/2016 0430   HGB 12.1 04/20/2016 1030   HCT 34.9 (L) 07/31/2016 0430   HCT 36.3 04/20/2016 1030   PLT 160 07/31/2016 0430   PLT 169 04/20/2016 1030   MCV 88.4 07/31/2016 0430   MCV 93 04/20/2016 1030   MCH 30.9 07/31/2016 0430   MCHC 35.0 07/31/2016 0430   RDW 14.1 07/31/2016 0430   RDW 14.9 04/20/2016 1030   Iron Studies No results found for: IRON, TIBC, FERRITIN, IRONPCTSAT Lipid Panel  No results found for: CHOL, TRIG, HDL, CHOLHDL, VLDL, LDLCALC, LDLDIRECT Hepatic Function Panel     Component Value Date/Time   PROT 6.9 11/22/2023 0936   ALBUMIN 4.2 11/22/2023 0936   AST 14 11/22/2023 0936   ALT 17 11/22/2023 0936   ALKPHOS 64 11/22/2023 0936   BILITOT <0.2 11/22/2023 0936   No results found for: TSH Nutritional No results found for: VD25OH  Medications: Outpatient Encounter Medications as of 02/12/2024  Medication Sig   ALPRAZolam  (XANAX ) 0.5 MG tablet Take 1 tablet (0.5 mg total) by mouth at bedtime as needed for anxiety or sleep (for night of sleep study, take 1 tab before bedtime and may repeat at bedtime).   azelastine  (ASTELIN ) 0.1 % nasal spray Place 2 sprays into both nostrils 2 (two) times daily as needed.   cetirizine  (ZYRTEC ) 10 MG tablet Take 1 tablet (10 mg total) by mouth daily.   diphenhydrAMINE  (BENADRYL ) 25 mg capsule Take 25 mg by mouth every 6 (six) hours as needed for allergies.   EPINEPHrine  0.3 mg/0.3 mL IJ SOAJ injection Inject contents of pen as needed for allergic reaction   ergocalciferol  (VITAMIN D2) 1.25 MG (50000 UT) capsule Take 1 capsule (50,000 Units total) by mouth once a week.   fluticasone  (FLONASE ) 50 MCG/ACT nasal spray Place 2 sprays into both nostrils daily.   fluticasone  (FLOVENT  HFA) 110 MCG/ACT inhaler Inhale 2 puffs into the lungs in the morning and at bedtime.   ibuprofen  (ADVIL ,MOTRIN ) 600 MG tablet Take 1 tablet (600 mg total) by mouth every 6 (six) hours.   levalbuterol  (XOPENEX   HFA) 45 MCG/ACT inhaler Inhale 2 puffs into the lungs every 6 (six) hours as needed for shortness of breath or wheezing   montelukast  (SINGULAIR ) 10 MG tablet Take 1 tablet (10 mg total) by mouth daily.   Multiple Vitamins-Minerals (MULTIVITAMIN WITH MINERALS) tablet Take 1 tablet by mouth daily.   Vitamin D , Ergocalciferol , (DRISDOL ) 1.25 MG (50000 UNIT) CAPS capsule Take 50,000 Units by mouth once a week.   [DISCONTINUED] metFORMIN  (GLUCOPHAGE -XR) 500 MG 24 hr tablet Take 1 tablet (500 mg total) by mouth 2 (two) times daily with a meal.   metFORMIN  (GLUCOPHAGE -XR) 500 MG 24 hr tablet Take 1 tablet (500 mg total) by mouth 2 (two) times daily with a meal.   No facility-administered encounter medications on file as of 02/12/2024.     Follow-Up   Return in about 4 weeks (around 03/11/2024) for For Weight Mangement with Dr. Francyne.SABRA She was informed of the importance of frequent follow up  visits to maximize her success with intensive lifestyle modifications for her multiple health conditions.  Attestation Statement   Reviewed by clinician on day of visit: allergies, medications, problem list, medical history, surgical history, family history, social history, and previous encounter notes.     Lucas Parker, MD

## 2024-02-12 NOTE — Assessment & Plan Note (Signed)
 Patient has a slower than predicted metabolism. IC 1493 vs. calculated 2100. This may contribute to weight gain, chronic fatigue and difficulty losing weight.   We reviewed measures to improve metabolism including not skipping meals, progressive strengthening exercises, increasing protein intake at every meal and maintaining adequate hydration and sleep.

## 2024-02-12 NOTE — Assessment & Plan Note (Signed)
 Shreshta is undergoing medical weight management and has lost ten pounds, with a decrease in body fat percentage from 55% to 53% and visceral fat from 20 to 19. She adheres to consuming whole foods, maintaining hydration, and exercising three days a week for sixty minutes. Her caloric intake is 1200 calories due to a slow metabolism. She manages occasional cravings well and is advised to treat herself in moderation to maintain adherence. Discussed potential use of anti-obesity medication for additional support, emphasizing the importance of a strong foundation for sustainable results. - Continue 1200-calorie nutrition plan - Maintain exercise routine of three days a week for sixty minutes - Target caloric intake of 1200 calories - Manage cravings by treating in moderation - Consider [employer] membership for strengthening exercises - Continue metformin  for weight management and insulin  resistance - Discuss anti-obesity medication if additional support is needed

## 2024-02-13 DIAGNOSIS — Z01419 Encounter for gynecological examination (general) (routine) without abnormal findings: Secondary | ICD-10-CM | POA: Diagnosis not present

## 2024-02-17 ENCOUNTER — Other Ambulatory Visit (HOSPITAL_COMMUNITY): Payer: Self-pay

## 2024-02-20 ENCOUNTER — Encounter: Admitting: Neurology

## 2024-02-22 ENCOUNTER — Other Ambulatory Visit (HOSPITAL_COMMUNITY): Payer: Self-pay

## 2024-02-24 ENCOUNTER — Other Ambulatory Visit (HOSPITAL_COMMUNITY): Payer: Self-pay

## 2024-03-04 ENCOUNTER — Other Ambulatory Visit (HOSPITAL_COMMUNITY): Payer: Self-pay

## 2024-03-12 ENCOUNTER — Encounter (INDEPENDENT_AMBULATORY_CARE_PROVIDER_SITE_OTHER): Payer: Self-pay | Admitting: Internal Medicine

## 2024-03-12 ENCOUNTER — Ambulatory Visit (INDEPENDENT_AMBULATORY_CARE_PROVIDER_SITE_OTHER): Admitting: Internal Medicine

## 2024-03-12 VITALS — BP 139/82 | HR 76 | Temp 98.7°F | Ht 63.0 in | Wt 308.0 lb

## 2024-03-12 DIAGNOSIS — G4733 Obstructive sleep apnea (adult) (pediatric): Secondary | ICD-10-CM

## 2024-03-12 DIAGNOSIS — Z6841 Body Mass Index (BMI) 40.0 and over, adult: Secondary | ICD-10-CM | POA: Diagnosis not present

## 2024-03-12 DIAGNOSIS — R948 Abnormal results of function studies of other organs and systems: Secondary | ICD-10-CM | POA: Diagnosis not present

## 2024-03-12 DIAGNOSIS — E66813 Obesity, class 3: Secondary | ICD-10-CM

## 2024-03-12 DIAGNOSIS — R7303 Prediabetes: Secondary | ICD-10-CM | POA: Diagnosis not present

## 2024-03-12 NOTE — Progress Notes (Unsigned)
 Office: [phone]  /  Fax: [phone]  Weight Summary and Body Composition Analysis (BIA)  Vitals Temp: 98.7 F (37.1 C) BP: 139/82 Pulse Rate: 76 SpO2: 100 %   Anthropometric Measurements Height: 5' 3 (1.6 m) Weight: (!) 308 lb (139.7 kg) BMI (Calculated): 54.57 Weight at Last Visit: 307lb Weight Lost Since Last Visit: 0lb Weight Gained Since Last Visit: 1lb Starting Weight: 317lb Total Weight Loss (lbs): 9 lb (4.082 kg)   Body Composition  Body Fat %: 55.9 % Fat Mass (lbs): 172.4 lbs Muscle Mass (lbs): 129.2 lbs Visceral Fat Rating : 20    RMR: [id number]  Today's Visit #: 5  Starting Date: 11/22/23   Subjective   Chief Complaint: Obesity  Interval History Discussed the use of AI scribe software for clinical note transcription with the patient, who gave verbal consent to proceed.  History of Present Illness Cassandra Foley is a 28 year old female who presents for medical weight management.  She has gained one pound since her last visit despite adhering to a 1200 calorie nutrition plan. Previously, she lost 13 pounds, reducing her weight from 320 to 307 pounds. She is uncertain about the cause of the recent weight gain as she continues to follow her diet and exercise routine.  She had a Nexplanon implant inserted on July 21st and recalls that when she first got on Nexplanon years ago, she gained a lot of weight, which she felt was due to poor eating habits at that time. However, she has not noticed any significant changes in her nutrition or physical activity since the implant was placed. Her exercise routine includes walking and using the elliptical, achieving 8,000 to 9,000 steps daily, without engaging in weight training. She does not have a gym membership but utilizes equipment at work and walks in her neighborhood.  She has moderate to severe sleep apnea, diagnosed in May, but has not started CPAP therapy due to financial constraints, including a $400  copay and $60 monthly fee. She has explored options for financial assistance but has not found a solution.  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 No depression, snacking, or swelling. No changes in mood or energy levels. She maintains a consistent physical activity routine and continues to drink water regularly.     Challenges affecting patient progress: inadequate sleep , slow metabolism for age, and inadequate response to nutritional and behavioral strategies.    Pharmacotherapy for weight management: She is currently taking Metformin  (off label use for weight management and / or insulin  resistance and / or diabetes prevention) with adequate clinical response  and without side effects..   Assessment and Plan   Treatment Plan For Obesity:  Recommended Dietary Goals  [name] is currently in the action stage of change. As such, her goal is to continue weight management plan. She has agreed to: continue current plan  Behavioral Health and Counseling  We discussed the following behavioral modification strategies today: continue to work on maintaining a reduced calorie state, getting the recommended amount of protein, incorporating whole foods, making healthy choices, staying well hydrated and practicing mindfulness when eating. and increase protein intake, fibrous foods (25 grams per day for women, 30 grams for men) and water to improve satiety and decrease hunger signals. .  Additional education and resources provided today: None  Recommended Physical Activity Goals  [name] has been advised to work up to 150 minutes of moderate intensity aerobic activity a week and strengthening  exercises 2-3 times per week for cardiovascular health, weight loss maintenance and preservation of muscle mass.  She has agreed to :  Think about enjoyable ways to increase daily physical activity and overcoming barriers to exercise, Increase physical activity in their day and  reduce sedentary time (increase NEAT)., Increase volume of physical activity to a goal of 240 minutes a week, and Combine aerobic and strengthening exercises for efficiency and improved cardiometabolic health.  Medical Interventions and Pharmacotherapy  We discussed various medication options to help [name] with her weight loss efforts and we both agreed to : Start anti-obesity medication.  In addition to reduced calorie nutrition plan (RCNP), behavioral strategies and physical activity, [name] would benefit from pharmacotherapy to assist with hunger signals, satiety and cravings. This will reduce obesity-related health risks by inducing weight loss, and help reduce food consumption and adherence to [facility]) . It may also improve QOL by improving self-confidence and reduce the  setbacks associated with metabolic adaptations.  Patient has a constellation of metabolic and cardiovascular risk factors associated with his obesity that significantly elevate their risk for adverse outcomes.  These include moderate to severe sleep apnea-untreated-unable to afford device and monthly lease, prediabetes, abnormal metabolic rate, asthma   After discussion of treatment options, mechanisms of action, benefits, side effects, contraindications and shared decision making she is agreeable to starting Zepbound  2.5 mg once a week. Patient also made aware that medication is indicated for long-term management of obesity and the risk of weight regain following discontinuation of treatment and hence the importance of adhering to medical weight loss plan.  We demonstrated use of device and patient using teach back method was able to demonstrate proper technique.  Associated Conditions Impacted by Obesity Treatment  Assessment & Plan Prediabetes I reviewed most recent labs.  Her A[facility]c is 5.9 [facility] and was 6.0 she has a mildly elevated insulin  levels at 18.  She is currently on metformin  for pharmacoprophylaxis  twice a day without any adverse effects.  Considering degree of obesity and associated conditions she would benefit from GLP-1 aided weight loss.  She will be started on Zepbound  2.5 mg once a week OSA (obstructive sleep apnea) Reviewed sleep study, Calculated pAHI (per AASM guideline):    25.2/h  REM pAHI:      52/h   NREM pAHI:      17/h .  Unfortunately unfortunately she cannot afford cost associated with CPAP machine. Moderate to severe obstructive sleep apnea diagnosed in May, significantly impacting metabolism and weight regulation. CPAP therapy not started due to financial constraints, with high initial cost and monthly payments. Emphasized importance of treating sleep apnea to aid in weight management and overall health. Discussed potential for purchasing a refurbished CPAP machine to reduce costs. Zepbound , a GLP-1 receptor agonist, is an FDA-approved treatment for sleep apnea and may aid in weight loss if insurance covers it. - Advise her to explore options for purchasing a refurbished CPAP machine to reduce costs. - Educate her on the importance of treating sleep apnea to improve weight management and metabolic health. - Submit prior authorization for Zepbound , highlighting sleep apnea as an FDA-approved indication for the medication. Losing 10 to 15% of body weight may improve condition.  She may benefit from weight loss aided by GLP-1 in this case Zepbound . Class 3 severe obesity with serious comorbidity and body mass index (BMI) of 50.0 to 59.9 in adult Abnormal metabolism Obesity with slow metabolism, with recent weight gain of one pound despite adherence  to a 1200 calorie diet and regular physical activity. Weight decreased from 320 to 307 pounds previously. Recent weight gain may be influenced by Nexplanon implant, although significant weight changes are uncommon. Frustration and discouragement due to lack of weight loss. Untreated sleep apnea may contribute to difficulty in weight  management. - Submit prior authorization for Zepbound  to insurance, citing moderate to severe sleep apnea as an indication. - Instruct her to research and consider purchasing a refurbished CPAP machine to address sleep apnea. - Advise her to continue current diet and exercise regimen, emphasizing the importance of maintaining healthy habits. - Educate her on the potential impact of untreated sleep apnea on weight management and metabolism.          Objective   Physical Exam:  Blood pressure 139/82, pulse 76, temperature 98.7 F (37.1 C), height 5' 3 (1.6 m), weight (!) 308 lb (139.7 kg), last menstrual period 02/03/2024, SpO2 100%, currently breastfeeding. Body mass index is 54.56 kg/m.  General: She is overweight, cooperative, alert, well developed, and in no acute distress. PSYCH: Has normal mood, affect and thought process.   HEENT: EOMI, sclerae are anicteric. Lungs: Normal breathing effort, no conversational dyspnea. Extremities: No edema.  Neurologic: No gross sensory or motor deficits. No tremors or fasciculations noted.    Diagnostic Data Reviewed:  BMET    Component Value Date/Time   NA 140 11/22/2023 0936   K 4.4 11/22/2023 0936   CL 104 11/22/2023 0936   CO2 22 11/22/2023 0936   GLUCOSE 89 11/22/2023 0936   GLUCOSE 89 10/28/2008 1711   BUN 9 11/22/2023 0936   CREATININE 0.70 11/22/2023 0936   CALCIUM 8.9 11/22/2023 0936   Lab Results  Component Value Date   HGBA1C 5.9 (H) 11/22/2023   Lab Results  Component Value Date   INSULIN  18.1 11/22/2023   No results found for: TSH CBC    Component Value Date/Time   WBC 4.9 07/31/2016 0430   RBC 3.95 07/31/2016 0430   HGB 12.2 07/31/2016 0430   HGB 12.1 04/20/2016 1030   HCT 34.9 (L) 07/31/2016 0430   HCT 36.3 04/20/2016 1030   PLT 160 07/31/2016 0430   PLT 169 04/20/2016 1030   MCV 88.4 07/31/2016 0430   MCV 93 04/20/2016 1030   MCH 30.9 07/31/2016 0430   MCHC 35.0 07/31/2016 0430   RDW 14.1  07/31/2016 0430   RDW 14.9 04/20/2016 1030   Iron Studies No results found for: IRON, TIBC, FERRITIN, IRONPCTSAT Lipid Panel  No results found for: CHOL, TRIG, HDL, CHOLHDL, VLDL, LDLCALC, LDLDIRECT Hepatic Function Panel     Component Value Date/Time   PROT 6.9 11/22/2023 0936   ALBUMIN 4.2 11/22/2023 0936   AST 14 11/22/2023 0936   ALT 17 11/22/2023 0936   ALKPHOS 64 11/22/2023 0936   BILITOT <0.2 11/22/2023 0936   No results found for: TSH Nutritional No results found for: VD25OH  Medications: Outpatient Encounter Medications as of 03/12/2024  Medication Sig   ALPRAZolam  (XANAX ) 0.5 MG tablet Take 1 tablet (0.5 mg total) by mouth at bedtime as needed for anxiety or sleep (for night of sleep study, take 1 tab before bedtime and may repeat at bedtime).   azelastine  (ASTELIN ) 0.1 % nasal spray Place 2 sprays into both nostrils 2 (two) times daily as needed.   cetirizine  (ZYRTEC ) 10 MG tablet Take 1 tablet (10 mg total) by mouth daily.   diphenhydrAMINE  (BENADRYL ) 25 mg capsule Take 25 mg by mouth every 6 (six)  hours as needed for allergies.   EPINEPHrine  0.3 mg/0.3 mL IJ SOAJ injection Inject contents of pen as needed for allergic reaction   ergocalciferol  (VITAMIN D2) 1.25 MG (50000 UT) capsule Take 1 capsule (50,000 Units total) by mouth once a week.   fluticasone  (FLONASE ) 50 MCG/ACT nasal spray Place 2 sprays into both nostrils daily.   fluticasone  (FLOVENT  HFA) 110 MCG/ACT inhaler Inhale 2 puffs into the lungs in the morning and at bedtime.   ibuprofen  (ADVIL ,MOTRIN ) 600 MG tablet Take 1 tablet (600 mg total) by mouth every 6 (six) hours.   levalbuterol  (XOPENEX  HFA) 45 MCG/ACT inhaler Inhale 2 puffs into the lungs every 6 (six) hours as needed for shortness of breath or wheezing   metFORMIN  (GLUCOPHAGE -XR) 500 MG 24 hr tablet Take 1 tablet (500 mg total) by mouth 2 (two) times daily with a meal.   montelukast  (SINGULAIR ) 10 MG tablet Take 1 tablet (10  mg total) by mouth daily.   Multiple Vitamins-Minerals (MULTIVITAMIN WITH MINERALS) tablet Take 1 tablet by mouth daily.   tirzepatide  (ZEPBOUND ) 2.5 MG/0.5ML injection vial Inject 2.5 mg into the skin once a week.   Vitamin D , Ergocalciferol , (DRISDOL ) 1.25 MG (50000 UNIT) CAPS capsule Take 50,000 Units by mouth once a week.   No facility-administered encounter medications on file as of 03/12/2024.     Follow-Up   Return in about 3 weeks (around 04/02/2024) for For Weight Mangement with Dr. Francyne.SABRA She was informed of the importance of frequent follow up visits to maximize her success with intensive lifestyle modifications for her multiple health conditions.  Attestation Statement   Reviewed by clinician on day of visit: allergies, medications, problem list, medical history, surgical history, family history, social history, and previous encounter notes.     Lucas Francyne, MD

## 2024-03-13 ENCOUNTER — Other Ambulatory Visit: Payer: Self-pay

## 2024-03-13 ENCOUNTER — Other Ambulatory Visit (HOSPITAL_COMMUNITY): Payer: Self-pay

## 2024-03-13 ENCOUNTER — Encounter (HOSPITAL_COMMUNITY): Payer: Self-pay | Admitting: Pharmacist

## 2024-03-13 MED ORDER — TIRZEPATIDE-WEIGHT MANAGEMENT 2.5 MG/0.5ML ~~LOC~~ SOAJ
2.5000 mg | SUBCUTANEOUS | 0 refills | Status: DC
  Filled 2024-03-13: qty 2, 28d supply, fill #0

## 2024-03-13 NOTE — Assessment & Plan Note (Signed)
 Obesity with slow metabolism, with recent weight gain of one pound despite adherence to a 1200 calorie diet and regular physical activity. Weight decreased from 320 to 307 pounds previously. Recent weight gain may be influenced by Nexplanon implant, although significant weight changes are uncommon. Frustration and discouragement due to lack of weight loss. Untreated sleep apnea may contribute to difficulty in weight management. - Submit prior authorization for Zepbound  to insurance, citing moderate to severe sleep apnea as an indication. - Instruct her to research and consider purchasing a refurbished CPAP machine to address sleep apnea. - Advise her to continue current diet and exercise regimen, emphasizing the importance of maintaining healthy habits. - Educate her on the potential impact of untreated sleep apnea on weight management and metabolism.

## 2024-03-13 NOTE — Assessment & Plan Note (Signed)
 Reviewed sleep study, Calculated pAHI (per AASM guideline):    25.2/h  REM pAHI:      52/h   NREM pAHI:      17/h .  Unfortunately unfortunately she cannot afford cost associated with CPAP machine. Moderate to severe obstructive sleep apnea diagnosed in May, significantly impacting metabolism and weight regulation. CPAP therapy not started due to financial constraints, with high initial cost and monthly payments. Emphasized importance of treating sleep apnea to aid in weight management and overall health. Discussed potential for purchasing a refurbished CPAP machine to reduce costs. Zepbound , a GLP-1 receptor agonist, is an FDA-approved treatment for sleep apnea and may aid in weight loss if insurance covers it. - Advise her to explore options for purchasing a refurbished CPAP machine to reduce costs. - Educate her on the importance of treating sleep apnea to improve weight management and metabolic health. - Submit prior authorization for Zepbound , highlighting sleep apnea as an FDA-approved indication for the medication. Losing 10 to 15% of body weight may improve condition.  She may benefit from weight loss aided by GLP-1 in this case Zepbound .

## 2024-03-13 NOTE — Assessment & Plan Note (Signed)
 I reviewed most recent labs.  Her A1c is 5.9 previously at [facility] and was 6.0 she has a mildly elevated insulin  levels at 18.  She is currently on metformin  for pharmacoprophylaxis twice a day without any adverse effects.  Considering degree of obesity and associated conditions she would benefit from GLP-1 aided weight loss.  She will be started on Zepbound  2.5 mg once a week

## 2024-03-24 ENCOUNTER — Other Ambulatory Visit (HOSPITAL_COMMUNITY): Payer: Self-pay

## 2024-04-09 ENCOUNTER — Encounter (INDEPENDENT_AMBULATORY_CARE_PROVIDER_SITE_OTHER): Payer: Self-pay | Admitting: Internal Medicine

## 2024-04-09 ENCOUNTER — Other Ambulatory Visit (HOSPITAL_COMMUNITY): Payer: Self-pay

## 2024-04-09 ENCOUNTER — Other Ambulatory Visit: Payer: Self-pay

## 2024-04-09 ENCOUNTER — Ambulatory Visit (INDEPENDENT_AMBULATORY_CARE_PROVIDER_SITE_OTHER): Admitting: Internal Medicine

## 2024-04-09 ENCOUNTER — Telehealth (HOSPITAL_COMMUNITY): Payer: Self-pay

## 2024-04-09 VITALS — BP 138/84 | HR 74 | Temp 98.7°F | Ht 63.0 in | Wt 305.0 lb

## 2024-04-09 DIAGNOSIS — G4733 Obstructive sleep apnea (adult) (pediatric): Secondary | ICD-10-CM | POA: Diagnosis not present

## 2024-04-09 DIAGNOSIS — E66813 Obesity, class 3: Secondary | ICD-10-CM | POA: Diagnosis not present

## 2024-04-09 DIAGNOSIS — Z6841 Body Mass Index (BMI) 40.0 and over, adult: Secondary | ICD-10-CM | POA: Diagnosis not present

## 2024-04-09 DIAGNOSIS — R7303 Prediabetes: Secondary | ICD-10-CM

## 2024-04-09 DIAGNOSIS — R948 Abnormal results of function studies of other organs and systems: Secondary | ICD-10-CM

## 2024-04-09 MED ORDER — PHENTERMINE-TOPIRAMATE ER 7.5-46 MG PO CP24
1.0000 | ORAL_CAPSULE | Freq: Every day | ORAL | 0 refills | Status: DC
  Filled 2024-04-09 – 2024-04-23 (×2): qty 30, 30d supply, fill #0

## 2024-04-09 MED ORDER — PHENTERMINE-TOPIRAMATE ER 3.75-23 MG PO CP24
1.0000 | ORAL_CAPSULE | Freq: Every day | ORAL | 0 refills | Status: DC
  Filled 2024-04-09 – 2024-04-12 (×2): qty 14, 14d supply, fill #0

## 2024-04-09 NOTE — Assessment & Plan Note (Signed)
 She has a non-REM AHI in the 50s.  CPAP treatment is cost prohibitive.  Advised to look for use machine and to contact supply store to see if they could help her obtain a mask.  She has been informed about the bidirectional relationship between apnea and weight.  Her insurance does not cover GLP-1 treatment.  Continue with current weight management strategy

## 2024-04-09 NOTE — Progress Notes (Signed)
 Office: [phone]  /  Fax: [phone]  Weight Summary and Body Composition Analysis (BIA)  Vitals Temp: 98.7 F (37.1 C) BP: 138/84 Pulse Rate: 74 SpO2: 100 %   Anthropometric Measurements Height: 5' 3 (1.6 m) Weight: (!) 305 lb (138.3 kg) BMI (Calculated): 54.04 Weight at Last Visit: 308 lb Weight Lost Since Last Visit: 3 lb Weight Gained Since Last Visit: 0 lb Starting Weight: 317 lb Total Weight Loss (lbs): 12 lb (5.443 kg)   Body Composition  Body Fat %: 53.1 % Fat Mass (lbs): 162.2 lbs Muscle Mass (lbs): 136.2 lbs Total Body Water (lbs): 108.8 lbs Visceral Fat Rating : 18    RMR: 1483  Today's Visit #: 6  Starting Date: 11/22/23   Subjective   Chief Complaint: Obesity  Interval History Discussed the use of AI scribe software for clinical note transcription with the patient, who gave verbal consent to proceed.  History of Present Illness Cassandra Foley is a 28 year old female with moderate to severe obstructive sleep apnea and prediabetes who presents for medical weight management.  Since last office visit she has lost 3 pounds.  She is following an 1500-calorie nutrition plan with fair adherence she is doing some tracking eating more whole foods getting the recommended amount of protein maintaining adequate hydration denies skipping meals she is exercising 3 days a week 60 minutes doing cardio.  She is exploring medication options for weight loss, including Zepbound , which was denied by her insurance, resulting in a high out-of-pocket cost of $630 even with a discount card. She is considering other medication options due to the cost.  She has been diagnosed with moderate to severe obstructive sleep apnea and is in the process of obtaining a CPAP machine. She has explored options for purchasing a used CPAP machine and is seeking assistance with mask fitting from local medical supply stores.  Her current medications include metformin  for weight  management and diabetes prevention. She is considering starting Qsymia , a combination of phentermine  and topiramate , for weight loss. She has no history of kidney stones.  She has recently lost three pounds since her last visit, which she attributes to her efforts in weight management. She has joined [employer] and is actively engaging in physical activity, utilizing the gym's facilities and classes to support her weight loss goals.  Her social history includes being sexually inactive and using Nexplanon for menstrual regulation. She is not at risk of pregnancy.     Challenges affecting patient progress: medical comorbidities and having difficulties with GLP-1 or AOM coverage.    Pharmacotherapy for weight management: She is currently taking no anti-obesity medication and GLP-1 treatment because prohibitive.   Assessment and Plan   Treatment Plan For Obesity:  Recommended Dietary Goals  Kesha is currently in the action stage of change. As such, her goal is to continue weight management plan. She has agreed to: continue current plan  Behavioral Health and Counseling  We discussed the following behavioral modification strategies today: continue to work on maintaining a reduced calorie state, getting the recommended amount of protein, incorporating whole foods, making healthy choices, staying well hydrated and practicing mindfulness when eating. and increase protein intake, fibrous foods (25 grams per day for women, 30 grams for men) and water to improve satiety and decrease hunger signals. .  Additional education and resources provided today: None  Recommended Physical Activity Goals  Harlynn has been advised to work up to 150 minutes of moderate intensity aerobic activity a  week and strengthening exercises 2-3 times per week for cardiovascular health, weight loss maintenance and preservation of muscle mass.  She has agreed to :  Think about enjoyable ways to increase daily  physical activity and overcoming barriers to exercise, Increase physical activity in their day and reduce sedentary time (increase NEAT)., Increase volume of physical activity to a goal of 240 minutes a week, and Combine aerobic and strengthening exercises for efficiency and improved cardiometabolic health.  Medical Interventions and Pharmacotherapy  We discussed various medication options to help Shaylie with her weight loss efforts and we both agreed to : Start anti-obesity medication.  In addition to reduced calorie nutrition plan (RCNP), behavioral strategies and physical activity, Daila would benefit from pharmacotherapy to assist with hunger signals, satiety and cravings. This will reduce obesity-related health risks by inducing weight loss, and help reduce food consumption and adherence to [facility]) . It may also improve QOL by improving self-confidence and reduce the  setbacks associated with metabolic adaptations.  After discussion of treatment options, mechanisms of action, benefits, side effects, contraindications and shared decision making she is agreeable to starting Qsymia . Patient also made aware that medication is indicated for long-term management of obesity and the risk of weight regain following discontinuation of treatment and hence the importance of adhering to medical weight loss plan.    We reviewed indications, medication side effects including risk of teratogenicity.  She is on Nexplanon and is not sexually active. We reviewed policies and patient signed controlled antiobesity medicine agreement. We reviewed  Controlled Substance Database, patient not on controlled substances.   Associated Conditions Impacted by Obesity Treatment  Assessment & Plan Class 3 severe obesity with serious comorbidity and body mass index (BMI) of 50.0 to 59.9 in adult Obesity management with a focus on long-term weight loss. Discussed weight loss medications, including Zepbound  and Qsymia . Zepbound   was not covered by insurance, resulting in high out-of-pocket costs. Qsymia , a combination of phentermine  and topiramate , was considered due to its effectiveness and potential insurance coverage. Emphasized the importance of gradual weight loss to avoid yo-yo dieting and maintain muscle mass. Encouraged exercise, particularly strength training, to boost metabolic rate. Discussed potential side effects of Qsymia , including increased heart rate, blood pressure, anxiety, numbness, tingling, and changes in taste. Explained that Qsymia  is contraindicated in pregnancy due to risk of birth defects and requires contraception use. - Prescribe Qsymia , starting with 3.75 mg phentermine /23 mg topiramate  for two weeks, then increase to 7.5 mg/46 mg if tolerated.  She is not sexually active. - Continue metformin  for weight management and diabetes prevention. - Encourage exercise, focusing on strength training and gradually increasing cardio to 150 minutes per week. - Schedule follow-up in four weeks. Prediabetes On metformin  for pharmacoprophylaxis without any adverse effects continue current regimen.  GLP-1 not covered by her insurance OSA (obstructive sleep apnea) -moderate to severe, HST 11/2023, not started PAP due to cost She has a non-REM AHI in the 50s.  CPAP treatment is cost prohibitive.  Advised to look for use machine and to contact supply store to see if they could help her obtain a mask.  She has been informed about the bidirectional relationship between apnea and weight.  Her insurance does not cover GLP-1 treatment.  Continue with current weight management strategy Abnormal metabolism She has enrolled in [employer] and will begin exercise program with a fitness instructor         Objective   Physical Exam:  Blood pressure 138/84, pulse 74, temperature  98.7 F (37.1 C), height 5' 3 (1.6 m), weight (!) 305 lb (138.3 kg), SpO2 100%, currently breastfeeding. Body mass index is 54.03  kg/m.  General: She is overweight, cooperative, alert, well developed, and in no acute distress. PSYCH: Has normal mood, affect and thought process.   HEENT: EOMI, sclerae are anicteric. Lungs: Normal breathing effort, no conversational dyspnea. Extremities: No edema.  Neurologic: No gross sensory or motor deficits. No tremors or fasciculations noted.    Diagnostic Data Reviewed:  BMET    Component Value Date/Time   NA 140 11/22/2023 0936   K 4.4 11/22/2023 0936   CL 104 11/22/2023 0936   CO2 22 11/22/2023 0936   GLUCOSE 89 11/22/2023 0936   GLUCOSE 89 10/28/2008 1711   BUN 9 11/22/2023 0936   CREATININE 0.70 11/22/2023 0936   CALCIUM 8.9 11/22/2023 0936   Lab Results  Component Value Date   HGBA1C 5.9 (H) 11/22/2023   Lab Results  Component Value Date   INSULIN  18.1 11/22/2023   No results found for: TSH CBC    Component Value Date/Time   WBC 4.9 07/31/2016 0430   RBC 3.95 07/31/2016 0430   HGB 12.2 07/31/2016 0430   HGB 12.1 04/20/2016 1030   HCT 34.9 (L) 07/31/2016 0430   HCT 36.3 04/20/2016 1030   PLT 160 07/31/2016 0430   PLT 169 04/20/2016 1030   MCV 88.4 07/31/2016 0430   MCV 93 04/20/2016 1030   MCH 30.9 07/31/2016 0430   MCHC 35.0 07/31/2016 0430   RDW 14.1 07/31/2016 0430   RDW 14.9 04/20/2016 1030   Iron Studies No results found for: IRON, TIBC, FERRITIN, IRONPCTSAT Lipid Panel  No results found for: CHOL, TRIG, HDL, CHOLHDL, VLDL, LDLCALC, LDLDIRECT Hepatic Function Panel     Component Value Date/Time   PROT 6.9 11/22/2023 0936   ALBUMIN 4.2 11/22/2023 0936   AST 14 11/22/2023 0936   ALT 17 11/22/2023 0936   ALKPHOS 64 11/22/2023 0936   BILITOT <0.2 11/22/2023 0936   No results found for: TSH Nutritional No results found for: VD25OH  Medications: Outpatient Encounter Medications as of 04/09/2024  Medication Sig   azelastine  (ASTELIN ) 0.1 % nasal spray Place 2 sprays into both nostrils 2 (two) times daily  as needed.   cetirizine  (ZYRTEC ) 10 MG tablet Take 1 tablet (10 mg total) by mouth daily.   diphenhydrAMINE  (BENADRYL ) 25 mg capsule Take 25 mg by mouth every 6 (six) hours as needed for allergies.   EPINEPHrine  0.3 mg/0.3 mL IJ SOAJ injection Inject contents of pen as needed for allergic reaction   ergocalciferol  (VITAMIN D2) 1.25 MG (50000 UT) capsule Take 1 capsule (50,000 Units total) by mouth once a week.   fluticasone  (FLONASE ) 50 MCG/ACT nasal spray Place 2 sprays into both nostrils daily.   fluticasone  (FLOVENT  HFA) 110 MCG/ACT inhaler Inhale 2 puffs into the lungs in the morning and at bedtime.   ibuprofen  (ADVIL ,MOTRIN ) 600 MG tablet Take 1 tablet (600 mg total) by mouth every 6 (six) hours.   levalbuterol  (XOPENEX  HFA) 45 MCG/ACT inhaler Inhale 2 puffs into the lungs every 6 (six) hours as needed for shortness of breath or wheezing   metFORMIN  (GLUCOPHAGE -XR) 500 MG 24 hr tablet Take 1 tablet (500 mg total) by mouth 2 (two) times daily with a meal.   montelukast  (SINGULAIR ) 10 MG tablet Take 1 tablet (10 mg total) by mouth daily.   Multiple Vitamins-Minerals (MULTIVITAMIN WITH MINERALS) tablet Take 1 tablet by mouth daily.  Phentermine -Topiramate  ER 3.75-23 MG CP24 Take 1 capsule by mouth daily before breakfast.   [START ON 04/23/2024] Phentermine -Topiramate  ER 7.5-46 MG CP24 Take 1 capsule by mouth daily.   tirzepatide  (ZEPBOUND ) 2.5 MG/0.5ML Pen Inject 2.5 mg into the skin once a week.   Vitamin D , Ergocalciferol , (DRISDOL ) 1.25 MG (50000 UNIT) CAPS capsule Take 50,000 Units by mouth once a week.   [DISCONTINUED] ALPRAZolam  (XANAX ) 0.5 MG tablet Take 1 tablet (0.5 mg total) by mouth at bedtime as needed for anxiety or sleep (for night of sleep study, take 1 tab before bedtime and may repeat at bedtime).   No facility-administered encounter medications on file as of 04/09/2024.     Follow-Up   Return in about 4 weeks (around 05/07/2024) for For Weight Mangement with Dr. Francyne.SABRA  She was informed of the importance of frequent follow up visits to maximize her success with intensive lifestyle modifications for her multiple health conditions.  Attestation Statement   Reviewed by clinician on day of visit: allergies, medications, problem list, medical history, surgical history, family history, social history, and previous encounter notes.     Lucas Francyne, MD

## 2024-04-09 NOTE — Assessment & Plan Note (Signed)
 On metformin  for pharmacoprophylaxis without any adverse effects continue current regimen.  GLP-1 not covered by her insurance

## 2024-04-09 NOTE — Assessment & Plan Note (Signed)
 She has enrolled in [employer] and will begin exercise program with a fitness instructor

## 2024-04-09 NOTE — Assessment & Plan Note (Signed)
 Obesity management with a focus on long-term weight loss. Discussed weight loss medications, including Zepbound  and Qsymia . Zepbound  was not covered by insurance, resulting in high out-of-pocket costs. Qsymia , a combination of phentermine  and topiramate , was considered due to its effectiveness and potential insurance coverage. Emphasized the importance of gradual weight loss to avoid yo-yo dieting and maintain muscle mass. Encouraged exercise, particularly strength training, to boost metabolic rate. Discussed potential side effects of Qsymia , including increased heart rate, blood pressure, anxiety, numbness, tingling, and changes in taste. Explained that Qsymia  is contraindicated in pregnancy due to risk of birth defects and requires contraception use. - Prescribe Qsymia , starting with 3.75 mg phentermine /23 mg topiramate  for two weeks, then increase to 7.5 mg/46 mg if tolerated.  She is not sexually active. - Continue metformin  for weight management and diabetes prevention. - Encourage exercise, focusing on strength training and gradually increasing cardio to 150 minutes per week. - Schedule follow-up in four weeks.

## 2024-04-10 ENCOUNTER — Telehealth (HOSPITAL_COMMUNITY): Payer: Self-pay

## 2024-04-10 ENCOUNTER — Other Ambulatory Visit (HOSPITAL_COMMUNITY): Payer: Self-pay

## 2024-04-10 NOTE — Telephone Encounter (Signed)
 Pharmacy Patient Advocate Encounter   Received notification from Pt Calls Messages that prior authorization for Phentermine -Topiramate  ER 3.75-23MG  er capsules  is required/requested.   Insurance verification completed.   The patient is insured through [facility] .   Per test claim: PA required; PA submitted to above mentioned insurance via Latent Key/confirmation #/EOC [id number] Status is pending

## 2024-04-10 NOTE — Telephone Encounter (Signed)
 PA request has been Received. New Encounter has been or will be created for follow up. For additional info see Pharmacy Prior Auth telephone encounter from 04/10/24.

## 2024-04-12 ENCOUNTER — Other Ambulatory Visit (HOSPITAL_COMMUNITY): Payer: Self-pay

## 2024-04-12 NOTE — Telephone Encounter (Signed)
 Pharmacy Patient Advocate Encounter  Received notification from [facility] that Prior Authorization for Phentermine -Topiramate  ER 3.75-23MG  er capsules  has been APPROVED from 04/11/24 to 04/25/24. Ran test claim, Copay is $5. This test claim was processed through [facility]- copay amounts may vary at other pharmacies due to pharmacy/plan contracts, or as the patient moves through the different stages of their insurance plan.   PA #/Case ID/Reference #: [id number]

## 2024-04-22 ENCOUNTER — Other Ambulatory Visit (HOSPITAL_COMMUNITY): Payer: Self-pay

## 2024-04-23 ENCOUNTER — Other Ambulatory Visit (HOSPITAL_COMMUNITY): Payer: Self-pay

## 2024-05-03 ENCOUNTER — Other Ambulatory Visit (HOSPITAL_COMMUNITY): Payer: Self-pay

## 2024-05-07 ENCOUNTER — Encounter (INDEPENDENT_AMBULATORY_CARE_PROVIDER_SITE_OTHER): Payer: Self-pay | Admitting: Internal Medicine

## 2024-05-07 ENCOUNTER — Ambulatory Visit (INDEPENDENT_AMBULATORY_CARE_PROVIDER_SITE_OTHER): Admitting: Internal Medicine

## 2024-05-07 ENCOUNTER — Other Ambulatory Visit (HOSPITAL_COMMUNITY): Payer: Self-pay

## 2024-05-07 VITALS — BP 136/88 | HR 75 | Temp 98.7°F | Ht 63.0 in | Wt 304.0 lb

## 2024-05-07 DIAGNOSIS — R948 Abnormal results of function studies of other organs and systems: Secondary | ICD-10-CM

## 2024-05-07 DIAGNOSIS — E66813 Obesity, class 3: Secondary | ICD-10-CM | POA: Diagnosis not present

## 2024-05-07 DIAGNOSIS — Z6841 Body Mass Index (BMI) 40.0 and over, adult: Secondary | ICD-10-CM | POA: Diagnosis not present

## 2024-05-07 DIAGNOSIS — R7303 Prediabetes: Secondary | ICD-10-CM

## 2024-05-07 MED ORDER — PHENTERMINE-TOPIRAMATE ER 7.5-46 MG PO CP24
1.0000 | ORAL_CAPSULE | Freq: Every day | ORAL | 0 refills | Status: DC
  Filled 2024-05-07 – 2024-05-28 (×4): qty 30, 30d supply, fill #0

## 2024-05-07 NOTE — Assessment & Plan Note (Signed)
 On metformin  for pharmacoprophylaxis without any adverse effects continue current regimen.  GLP-1 not covered by her insurance.  Check hemoglobin A1c at the next office visit

## 2024-05-07 NOTE — Assessment & Plan Note (Signed)
 Patient has a slower than predicted metabolism. IC 1483 vs. calculated 2087. This may contribute to weight gain, chronic fatigue and difficulty losing weight.   We reviewed measures to improve metabolism including not skipping meals, progressive strengthening exercises, increasing protein intake at every meal and maintaining adequate hydration and sleep.

## 2024-05-07 NOTE — Assessment & Plan Note (Signed)
 Weight: decrease of 16 lb (5%) over 6 months, 2 weeks  Start: 10/17/2023 320 lb (145.2 kg) (H)  End: 05/07/2024 304 lb (137.9 kg) (H) Obesity with slow metabolic rate Obesity with a slow metabolic rate, currently managed with Qsymia . She reports decreased appetite and difficulty consuming soda, necessitating intentional eating to avoid skipping meals. Despite significant physical activity, including regular gym workouts, weight loss has been slower than expected, attributed to a slow metabolic rate. Her metabolic rate was previously measured at 1483, lower than the expected 2087. She is losing weight at a healthy pace, approximately one to three pounds per month, consistent with her metabolic profile. - Continue Qsymia  at the current dose. - Denies being sexually active - Ensure reliable birth control if she decides to become sexually active while on Qsymia  due to potential teratogenic effects. - Encourage three meals a day with adequate protein intake. - Use protein shakes or bars as meal replacements if necessary. - Maintain current physical activity levels, aiming for at least 240 minutes per week. - Schedule follow-up visit in November to check A1c levels.

## 2024-05-07 NOTE — Progress Notes (Signed)
 Office: [phone]  /  Fax: [phone]  Weight Summary and Body Composition Analysis (BIA)  Vitals Temp: 98.7 F (37.1 C) BP: 136/88 Pulse Rate: 75 SpO2: 100 %   Anthropometric Measurements Height: 5' 3 (1.6 m) Weight: (!) 304 lb (137.9 kg) BMI (Calculated): 53.86 Weight at Last Visit: 305lb Weight Lost Since Last Visit: 1lb Weight Gained Since Last Visit: 0lb Starting Weight: 317lb Total Weight Loss (lbs): 13 lb (5.897 kg)   Body Composition  Body Fat %: 54 % Fat Mass (lbs): 164.2 lbs Muscle Mass (lbs): 132.8 lbs Visceral Fat Rating : 19    RMR: 1483  Today's Visit #: 7  Starting Date: 11/22/23   Subjective   Chief Complaint: Obesity  Interval History Discussed the use of AI scribe software for clinical note transcription with the patient, who gave verbal consent to proceed.  History of Present Illness Cassandra Foley is a 28 year old female who presents for medical weight management.  Since last office visit Cassandra Foley has lost 1 pound this brings her to a total of 13 pounds.  Cassandra Foley reports following a 1000-calorie nutrition plan 60% of the time Cassandra Foley has not been tracking reports incorporating more whole foods getting the recommended amount of protein maintaining adequate hydration Cassandra Foley is exercising 3 days a week 120 minutes.  Cassandra Foley is currently on Qsymia  started the medication last office visit.  The medication acts as an appetite suppressant, making her feel fuller quicker and requiring her to be intentional about eating. Cassandra Foley has noticed a decreased ability to consume soda and has to make herself eat to avoid skipping meals. . No side effects such as nausea are reported.  Cassandra Foley is actively engaging in physical activity, regularly attending the gym. Cassandra Foley reports significant improvements in her endurance and strength, noting that Cassandra Foley can now do 20 minutes on the Stairmaster, whereas Cassandra Foley initially struggled with three minutes. Cassandra Foley also completed two miles on the  treadmill and engaged in upper body exercises. Cassandra Foley finds peace of mind in her workouts and feels like a 'new person' after exercising.  Despite her efforts, Cassandra Foley is concerned about her weight loss progress, having lost only one pound recently. Her metabolism was previously measured at 1483 kcal/day, which is lower than the expected 2087 kcal/day.  Her family history includes both parents being diabetic, which Cassandra Foley acknowledges as a risk factor for her own health.     Challenges affecting patient progress: weight-centric mindset.    Pharmacotherapy for weight management: Cassandra Foley is currently taking Metformin  (off label use for weight management and / or insulin  resistance and / or diabetes prevention) with adequate clinical response  and without side effects. and Qsymia  with adequate clinical response  and without side effects..   Assessment and Plan   Treatment Plan For Obesity:  Recommended Dietary Goals  Cassandra Foley is currently in the action stage of change. As such, her goal is to continue weight management plan. Cassandra Foley has agreed to: continue current plan  Behavioral Health and Counseling  We discussed the following behavioral modification strategies today: increasing lean protein intake to established goals, decreasing simple carbohydrates , increasing vegetables, increasing fiber rich foods, avoiding skipping meals, increasing water intake , work on tracking and journaling calories using tracking application, and increase protein intake, fibrous foods (25 grams per day for women, 30 grams for men) and water to improve satiety and decrease hunger signals. .  Additional education and resources provided today: Handout on benefits of exercise on body systems  Recommended Physical Activity Goals  Cassandra Foley has been advised to work up to 150 minutes of moderate intensity aerobic activity a week and strengthening exercises 2-3 times per week for cardiovascular health, weight loss maintenance and  preservation of muscle mass.  Cassandra Foley has agreed to :  Discussed changing exercise routine to avoid adaptation plateau or training plateau  Medical Interventions and Pharmacotherapy  We discussed various medication options to help Cassandra Foley with her weight loss efforts and we both agreed to : Adequate clinical response to anti-obesity medication, continue current regimen  Associated Conditions Impacted by Obesity Treatment  Assessment & Plan Prediabetes On metformin  for pharmacoprophylaxis without any adverse effects continue current regimen.  GLP-1 not covered by her insurance.  Check hemoglobin A1c at the next office visit Abnormal metabolism Patient has a slower than predicted metabolism. Cassandra Foley 1483 vs. calculated 2087. This may contribute to weight gain, chronic fatigue and difficulty losing weight.   We reviewed measures to improve metabolism including not skipping meals, progressive strengthening exercises, increasing protein intake at every meal and maintaining adequate hydration and sleep.   Class 3 severe obesity with serious comorbidity and body mass index (BMI) of 50.0 to 59.9 in adult, unspecified obesity type (HCC) Weight: decrease of 16 lb (5%) over 6 months, 2 weeks  Start: 10/17/2023 320 lb (145.2 kg) (H)  End: 05/07/2024 304 lb (137.9 kg) (H) Obesity with slow metabolic rate Obesity with a slow metabolic rate, currently managed with Qsymia . Cassandra Foley reports decreased appetite and difficulty consuming soda, necessitating intentional eating to avoid skipping meals. Despite significant physical activity, including regular gym workouts, weight loss has been slower than expected, attributed to a slow metabolic rate. Her metabolic rate was previously measured at 1483, lower than the expected 2087. Cassandra Foley is losing weight at a healthy pace, approximately one to three pounds per month, consistent with her metabolic profile. - Continue Qsymia  at the current dose. - Denies being sexually active -  Ensure reliable birth control if Cassandra Foley decides to become sexually active while on Qsymia  due to potential teratogenic effects. - Encourage three meals a day with adequate protein intake. - Use protein shakes or bars as meal replacements if necessary. - Maintain current physical activity levels, aiming for at least 240 minutes per week. - Schedule follow-up visit in November to check A1c levels.          Objective   Physical Exam:  Blood pressure 136/88, pulse 75, temperature 98.7 F (37.1 C), height 5' 3 (1.6 m), weight (!) 304 lb (137.9 kg), SpO2 100%, currently breastfeeding. Body mass index is 53.85 kg/m.  General: Cassandra Foley is overweight, cooperative, alert, well developed, and in no acute distress. PSYCH: Has normal mood, affect and thought process.   HEENT: EOMI, sclerae are anicteric. Lungs: Normal breathing effort, no conversational dyspnea. Extremities: No edema.  Neurologic: No gross sensory or motor deficits. No tremors or fasciculations noted.    Diagnostic Data Reviewed:  BMET    Component Value Date/Time   NA 140 11/22/2023 0936   K 4.4 11/22/2023 0936   CL 104 11/22/2023 0936   CO2 22 11/22/2023 0936   GLUCOSE 89 11/22/2023 0936   GLUCOSE 89 10/28/2008 1711   BUN 9 11/22/2023 0936   CREATININE 0.70 11/22/2023 0936   CALCIUM 8.9 11/22/2023 0936   Lab Results  Component Value Date   HGBA1C 5.9 (H) 11/22/2023   Lab Results  Component Value Date   INSULIN  18.1 11/22/2023   No results found for: TSH CBC  Component Value Date/Time   WBC 4.9 07/31/2016 0430   RBC 3.95 07/31/2016 0430   HGB 12.2 07/31/2016 0430   HGB 12.1 04/20/2016 1030   HCT 34.9 (L) 07/31/2016 0430   HCT 36.3 04/20/2016 1030   PLT 160 07/31/2016 0430   PLT 169 04/20/2016 1030   MCV 88.4 07/31/2016 0430   MCV 93 04/20/2016 1030   MCH 30.9 07/31/2016 0430   MCHC 35.0 07/31/2016 0430   RDW 14.1 07/31/2016 0430   RDW 14.9 04/20/2016 1030   Iron Studies No results found for:  IRON, TIBC, FERRITIN, IRONPCTSAT Lipid Panel  No results found for: CHOL, TRIG, HDL, CHOLHDL, VLDL, LDLCALC, LDLDIRECT Hepatic Function Panel     Component Value Date/Time   PROT 6.9 11/22/2023 0936   ALBUMIN 4.2 11/22/2023 0936   AST 14 11/22/2023 0936   ALT 17 11/22/2023 0936   ALKPHOS 64 11/22/2023 0936   BILITOT <0.2 11/22/2023 0936   No results found for: TSH Nutritional No results found for: VD25OH  Medications: Outpatient Encounter Medications as of 05/07/2024  Medication Sig   azelastine  (ASTELIN ) 0.1 % nasal spray Place 2 sprays into both nostrils 2 (two) times daily as needed.   cetirizine  (ZYRTEC ) 10 MG tablet Take 1 tablet (10 mg total) by mouth daily.   diphenhydrAMINE  (BENADRYL ) 25 mg capsule Take 25 mg by mouth every 6 (six) hours as needed for allergies.   EPINEPHrine  0.3 mg/0.3 mL IJ SOAJ injection Inject contents of pen as needed for allergic reaction   ergocalciferol  (VITAMIN D2) 1.25 MG (50000 UT) capsule Take 1 capsule (50,000 Units total) by mouth once a week.   fluticasone  (FLONASE ) 50 MCG/ACT nasal spray Place 2 sprays into both nostrils daily.   fluticasone  (FLOVENT  HFA) 110 MCG/ACT inhaler Inhale 2 puffs into the lungs in the morning and at bedtime.   ibuprofen  (ADVIL ,MOTRIN ) 600 MG tablet Take 1 tablet (600 mg total) by mouth every 6 (six) hours.   levalbuterol  (XOPENEX  HFA) 45 MCG/ACT inhaler Inhale 2 puffs into the lungs every 6 (six) hours as needed for shortness of breath or wheezing   metFORMIN  (GLUCOPHAGE -XR) 500 MG 24 hr tablet Take 1 tablet (500 mg total) by mouth 2 (two) times daily with a meal.   montelukast  (SINGULAIR ) 10 MG tablet Take 1 tablet (10 mg total) by mouth daily.   Multiple Vitamins-Minerals (MULTIVITAMIN WITH MINERALS) tablet Take 1 tablet by mouth daily.   Vitamin D , Ergocalciferol , (DRISDOL ) 1.25 MG (50000 UNIT) CAPS capsule Take 50,000 Units by mouth once a week.   [DISCONTINUED] Phentermine -Topiramate  ER  3.75-23 MG CP24 Take 1 capsule by mouth daily before breakfast.   [DISCONTINUED] Phentermine -Topiramate  ER 7.5-46 MG CP24 Take 1 capsule by mouth daily.   Phentermine -Topiramate  ER 7.5-46 MG CP24 Take 1 capsule by mouth daily.   No facility-administered encounter medications on file as of 05/07/2024.     Follow-Up   Return in about 4 weeks (around 06/04/2024) for For Weight Mangement with Dr. Francyne.SABRA Cassandra Foley was informed of the importance of frequent follow up visits to maximize her success with intensive lifestyle modifications for her multiple health conditions.  Attestation Statement   Reviewed by clinician on day of visit: allergies, medications, problem list, medical history, surgical history, family history, social history, and previous encounter notes.     Lucas Francyne, MD

## 2024-05-14 ENCOUNTER — Other Ambulatory Visit: Payer: Self-pay | Admitting: Internal Medicine

## 2024-05-14 ENCOUNTER — Other Ambulatory Visit (HOSPITAL_COMMUNITY): Payer: Self-pay

## 2024-05-14 ENCOUNTER — Other Ambulatory Visit: Payer: Self-pay

## 2024-05-16 ENCOUNTER — Encounter (HOSPITAL_COMMUNITY): Payer: Self-pay

## 2024-05-16 ENCOUNTER — Other Ambulatory Visit (HOSPITAL_COMMUNITY): Payer: Self-pay

## 2024-05-16 ENCOUNTER — Other Ambulatory Visit: Payer: Self-pay | Admitting: Internal Medicine

## 2024-05-28 ENCOUNTER — Other Ambulatory Visit: Payer: Self-pay | Admitting: Internal Medicine

## 2024-05-28 ENCOUNTER — Other Ambulatory Visit (HOSPITAL_COMMUNITY): Payer: Self-pay

## 2024-05-28 ENCOUNTER — Other Ambulatory Visit: Payer: Self-pay

## 2024-05-29 ENCOUNTER — Encounter: Payer: Self-pay | Admitting: Internal Medicine

## 2024-05-29 ENCOUNTER — Other Ambulatory Visit (HOSPITAL_COMMUNITY): Payer: Self-pay

## 2024-05-29 DIAGNOSIS — J4531 Mild persistent asthma with (acute) exacerbation: Secondary | ICD-10-CM | POA: Diagnosis not present

## 2024-05-29 MED ORDER — DEXAMETHASONE 6 MG PO TABS
6.0000 mg | ORAL_TABLET | Freq: Every day | ORAL | 0 refills | Status: AC
  Filled 2024-05-29: qty 5, 5d supply, fill #0

## 2024-05-29 MED ORDER — ALBUTEROL SULFATE HFA 108 (90 BASE) MCG/ACT IN AERS
2.0000 | INHALATION_SPRAY | RESPIRATORY_TRACT | 0 refills | Status: DC | PRN
  Filled 2024-05-29: qty 6.7, 16d supply, fill #0

## 2024-06-06 ENCOUNTER — Other Ambulatory Visit (HOSPITAL_COMMUNITY): Payer: Self-pay

## 2024-06-06 ENCOUNTER — Encounter (INDEPENDENT_AMBULATORY_CARE_PROVIDER_SITE_OTHER): Payer: Self-pay | Admitting: Internal Medicine

## 2024-06-06 ENCOUNTER — Ambulatory Visit (INDEPENDENT_AMBULATORY_CARE_PROVIDER_SITE_OTHER): Payer: Self-pay | Admitting: Internal Medicine

## 2024-06-06 VITALS — BP 121/82 | HR 92 | Temp 98.0°F | Ht 63.0 in | Wt 296.0 lb

## 2024-06-06 DIAGNOSIS — G4733 Obstructive sleep apnea (adult) (pediatric): Secondary | ICD-10-CM | POA: Diagnosis not present

## 2024-06-06 DIAGNOSIS — Z6841 Body Mass Index (BMI) 40.0 and over, adult: Secondary | ICD-10-CM

## 2024-06-06 DIAGNOSIS — R7303 Prediabetes: Secondary | ICD-10-CM | POA: Diagnosis not present

## 2024-06-06 DIAGNOSIS — E66813 Obesity, class 3: Secondary | ICD-10-CM | POA: Diagnosis not present

## 2024-06-06 DIAGNOSIS — R948 Abnormal results of function studies of other organs and systems: Secondary | ICD-10-CM

## 2024-06-06 MED ORDER — PHENTERMINE-TOPIRAMATE ER 7.5-46 MG PO CP24
1.0000 | ORAL_CAPSULE | Freq: Every day | ORAL | 0 refills | Status: DC
  Filled 2024-06-06 – 2024-06-24 (×2): qty 30, 30d supply, fill #0

## 2024-06-06 NOTE — Progress Notes (Signed)
 Office: [phone]  /  Fax: [phone]  Weight Summary and Body Composition Analysis (BIA)  Vitals Temp: 98 F (36.7 C) BP: 121/82 Pulse Rate: 92 SpO2: 100 %   Anthropometric Measurements Height: 5' 3 (1.6 m) Weight: 296 lb (134.3 kg) BMI (Calculated): 52.45 Weight at Last Visit: 304 lb Weight Lost Since Last Visit: 8 lb Weight Gained Since Last Visit: 0 lb Starting Weight: 317 lb Total Weight Loss (lbs): 21 lb (9.526 kg)   Body Composition  Body Fat %: 51.3 % Fat Mass (lbs): 152 lbs Muscle Mass (lbs): 137 lbs Total Body Water (lbs): 100.6 lbs Visceral Fat Rating : 17    RMR: 1483  Today's Visit #: 8  Starting Date: 11/22/23   Subjective   Chief Complaint: Obesity  Interval History Discussed the use of AI scribe software for clinical note transcription with the patient, who gave verbal consent to proceed.  History of Present Illness Cassandra Foley is a 28 year old female who presents for medical weight management.  She has lost eight pounds since her last office visit. She follows a 1200 calorie nutrition plan approximately 70% of the time and exercises three days a week for 90 minutes each session. She feels good and has more energy, noting that she is not sleepy all the time and is taking the stairs more often at work.  She drinks protein shakes in the morning and no longer skips meals. She has reduced her intake of soda and fast food, attributing some of her success to these changes.  She is currently taking Qsymia  at a dose of 7.5 mg, which she has been on for two weeks. No side effects such as numbness or tingling. She pays $10 for her medication, which is covered by insurance.  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 She has started meal prepping but finds it tedious due to her work and homework schedule.     Challenges affecting patient  progress: none.    Pharmacotherapy for weight management: She is currently taking Qsymia  with adequate clinical response  and without side effects..   Assessment and Plan   Treatment Plan For Obesity:  Recommended Dietary Goals  Cassandra Foley is currently in the action stage of change. As such, her goal is to continue weight management plan. She has agreed to: continue current plan  Behavioral Health and Counseling  We discussed the following behavioral modification strategies today: continue to work on maintaining a reduced calorie state, getting the recommended amount of protein, incorporating whole foods, making healthy choices, staying well hydrated and practicing mindfulness when eating. and increase protein intake, fibrous foods (25 grams per day for women, 30 grams for men) and water to improve satiety and decrease hunger signals. .  Additional education and resources provided today: None  Recommended Physical Activity Goals  Cassandra Foley has been advised to work up to 150 minutes of moderate intensity aerobic activity a week and strengthening exercises 2-3 times per week for cardiovascular health, weight loss maintenance and preservation of muscle mass.  She has agreed to :  Think about enjoyable ways to increase daily physical activity and overcoming barriers to exercise, Increase physical activity in their day and reduce sedentary time (increase NEAT)., Increase volume of physical activity to a goal of 240 minutes a week, and Combine aerobic and strengthening exercises for efficiency and improved cardiometabolic health.  Medical Interventions and Pharmacotherapy  We  discussed various medication options to help Cassandra Foley with her weight loss efforts and we both agreed to : Adequate clinical response to anti-obesity medication, continue current regimen and Patient was counseled on the importance of maintaining healthy lifestyle habits, including balanced nutrition, regular physical activity, and  behavioral modifications, while taking antiobesity medication.  Patient verbalized understanding that medication is an adjunct to, not a replacement for, lifestyle changes and that the long-term success and weight maintenance depend on continued adherence to these strategies.  We reviewed indications, medication side effects including risk of teratogenicity.  She denies being sexually active. We reviewed policies and patient signed controlled antiobesity medicine agreement. We reviewed [place] Controlled Substance Database Vital signs are stable  Associated Conditions Impacted by Obesity Treatment  Assessment & Plan Abnormal metabolism Class 3 severe obesity with serious comorbidity and body mass index (BMI) of 50.0 to 59.9 in adult, unspecified obesity type (HCC) OSA (obstructive sleep apnea) -moderate to severe, HST 11/2023, not started PAP due to cost Prediabetes Class 3 obesity with significant weight loss of 8 pounds since last visit. Following a 1200 calorie nutrition plan 70% of the time and exercising 90 minutes, 3 days a week. No side effects from Qsymia  7.5 mg. Blood pressure and heart rate are stable. No risk of pregnancy as she is using reliable contraception. Emphasis on lifestyle changes rather than viewing it as a diet. Discussion on meal planning and preparation to avoid decision fatigue and maintain healthy eating habits. Encouraged to continue current regimen and monitor for weight loss plateau. - Continue Qsymia  7.5 mg daily. - Maintain 1200 calorie nutrition plan and exercise regimen. - Ensure reliable contraception to prevent pregnancy if she becomes sexually active while on Qsymia . - Consider prepackaged meal services for convenience. - Focus on meal planning and preparation, especially on weekends. - Monitor for weight loss plateau and adjust medication if necessary.   - Consider metformin  as an adjunct for diabetes prevention. -CPAP remains untreated due to cost of  equipment      Objective   Physical Exam:  Blood pressure 121/82, pulse 92, temperature 98 F (36.7 C), height 5' 3 (1.6 m), weight 296 lb (134.3 kg), SpO2 100%, currently breastfeeding. Body mass index is 52.43 kg/m.  General: She is overweight, cooperative, alert, well developed, and in no acute distress. PSYCH: Has normal mood, affect and thought process.   HEENT: EOMI, sclerae are anicteric. Lungs: Normal breathing effort, no conversational dyspnea. Extremities: No edema.  Neurologic: No gross sensory or motor deficits. No tremors or fasciculations noted.    Diagnostic Data Reviewed:  BMET    Component Value Date/Time   NA 140 11/22/2023 0936   K 4.4 11/22/2023 0936   CL 104 11/22/2023 0936   CO2 22 11/22/2023 0936   GLUCOSE 89 11/22/2023 0936   GLUCOSE 89 10/28/2008 1711   BUN 9 11/22/2023 0936   CREATININE 0.70 11/22/2023 0936   CALCIUM 8.9 11/22/2023 0936   Lab Results  Component Value Date   HGBA1C 5.9 (H) 11/22/2023   Lab Results  Component Value Date   INSULIN  18.1 11/22/2023   No results found for: TSH CBC    Component Value Date/Time   WBC 4.9 07/31/2016 0430   RBC 3.95 07/31/2016 0430   HGB 12.2 07/31/2016 0430   HGB 12.1 04/20/2016 1030   HCT 34.9 (L) 07/31/2016 0430   HCT 36.3 04/20/2016 1030   PLT 160 07/31/2016 0430   PLT 169 04/20/2016 1030   MCV 88.4 07/31/2016 0430  MCV 93 04/20/2016 1030   MCH 30.9 07/31/2016 0430   MCHC 35.0 07/31/2016 0430   RDW 14.1 07/31/2016 0430   RDW 14.9 04/20/2016 1030   Iron Studies No results found for: IRON, TIBC, FERRITIN, IRONPCTSAT Lipid Panel  No results found for: CHOL, TRIG, HDL, CHOLHDL, VLDL, LDLCALC, LDLDIRECT Hepatic Function Panel     Component Value Date/Time   PROT 6.9 11/22/2023 0936   ALBUMIN 4.2 11/22/2023 0936   AST 14 11/22/2023 0936   ALT 17 11/22/2023 0936   ALKPHOS 64 11/22/2023 0936   BILITOT <0.2 11/22/2023 0936   No results found for:  TSH Nutritional No results found for: VD25OH  Medications: Outpatient Encounter Medications as of 06/06/2024  Medication Sig   albuterol  (VENTOLIN  HFA) 108 (90 Base) MCG/ACT inhaler Inhale 2 puffs into the lungs every 4 (four) hours as needed for wheezing.   azelastine  (ASTELIN ) 0.1 % nasal spray Place 2 sprays into both nostrils 2 (two) times daily as needed.   cetirizine  (ZYRTEC ) 10 MG tablet Take 1 tablet (10 mg total) by mouth daily.   diphenhydrAMINE  (BENADRYL ) 25 mg capsule Take 25 mg by mouth every 6 (six) hours as needed for allergies.   EPINEPHrine  0.3 mg/0.3 mL IJ SOAJ injection Inject contents of pen as needed for allergic reaction   ergocalciferol  (VITAMIN D2) 1.25 MG (50000 UT) capsule Take 1 capsule (50,000 Units total) by mouth once a week.   fluticasone  (FLONASE ) 50 MCG/ACT nasal spray Place 2 sprays into both nostrils daily.   fluticasone  (FLOVENT  HFA) 110 MCG/ACT inhaler Inhale 2 puffs into the lungs in the morning and at bedtime.   ibuprofen  (ADVIL ,MOTRIN ) 600 MG tablet Take 1 tablet (600 mg total) by mouth every 6 (six) hours.   levalbuterol  (XOPENEX  HFA) 45 MCG/ACT inhaler Inhale 2 puffs into the lungs every 6 (six) hours as needed for shortness of breath or wheezing   metFORMIN  (GLUCOPHAGE -XR) 500 MG 24 hr tablet Take 1 tablet (500 mg total) by mouth 2 (two) times daily with a meal.   montelukast  (SINGULAIR ) 10 MG tablet Take 1 tablet (10 mg total) by mouth daily.   Multiple Vitamins-Minerals (MULTIVITAMIN WITH MINERALS) tablet Take 1 tablet by mouth daily.   Phentermine -Topiramate  ER 7.5-46 MG CP24 Take 1 capsule by mouth daily.   Vitamin D , Ergocalciferol , (DRISDOL ) 1.25 MG (50000 UNIT) CAPS capsule Take 50,000 Units by mouth once a week.   No facility-administered encounter medications on file as of 06/06/2024.     Follow-Up   No follow-ups on file.SABRA She was informed of the importance of frequent follow up visits to maximize her success with intensive  lifestyle modifications for her multiple health conditions.  Attestation Statement   Reviewed by clinician on day of visit: allergies, medications, problem list, medical history, surgical history, family history, social history, and previous encounter notes.     Lucas Parker, MD

## 2024-06-07 NOTE — Assessment & Plan Note (Signed)
 Class 3 obesity with significant weight loss of 8 pounds since last visit. Following a 1200 calorie nutrition plan 70% of the time and exercising 90 minutes, 3 days a week. No side effects from Qsymia  7.5 mg. Blood pressure and heart rate are stable. No risk of pregnancy as she is using reliable contraception. Emphasis on lifestyle changes rather than viewing it as a diet. Discussion on meal planning and preparation to avoid decision fatigue and maintain healthy eating habits. Encouraged to continue current regimen and monitor for weight loss plateau. - Continue Qsymia  7.5 mg daily. - Maintain 1200 calorie nutrition plan and exercise regimen. - Ensure reliable contraception to prevent pregnancy if she becomes sexually active while on Qsymia . - Consider prepackaged meal services for convenience. - Focus on meal planning and preparation, especially on weekends. - Monitor for weight loss plateau and adjust medication if necessary.   - Consider metformin  as an adjunct for diabetes prevention. -CPAP remains untreated due to cost of equipment

## 2024-06-10 ENCOUNTER — Other Ambulatory Visit (HOSPITAL_COMMUNITY): Payer: Self-pay

## 2024-06-19 ENCOUNTER — Other Ambulatory Visit (HOSPITAL_COMMUNITY): Payer: Self-pay

## 2024-06-23 NOTE — Patient Instructions (Signed)
 Asthma Continue Flovent  110-2 puffs twice a day with a spacer to prevent cough and wheeze Continue albuterol  2 puffs every 4 hours as needed for cough or wheeze OR Instead use albuterol  0.083% solution via nebulizer one unit vial every 4 hours as needed for cough or wheeze  You may use albuterol  2 puffs 5-15 mintues before activity to decrease cough or wheeze Continue montelukast  10 mg once a day to prevent cough or wheeze  Allergic rhinitis Continue allergen avoidance measures driected toward pollens, mold, pets, dust mites, and cockroach as listed below  Food allergy  Continue to avoid peanuts, shellfish, and tomato.  In case of an allergic reaction, take cetirizine  10 mg once every 12-24 hours, and if life-threatening symptoms occur, inject with EpiPen  0.3 mg.   Call the clinic if this treatment plan is not working well for you  Follow up in *** or sooner if needed.  Reducing Pollen Exposure The American Academy of Allergy , Asthma and Immunology suggests the following steps to reduce your exposure to pollen during allergy  seasons. Do not hang sheets or clothing out to dry; pollen may collect on these items. Do not mow lawns or spend time around freshly cut grass; mowing stirs up pollen. Keep windows closed at night.  Keep car windows closed while driving. Minimize morning activities outdoors, a time when pollen counts are usually at their highest. Stay indoors as much as possible when pollen counts or humidity is high and on windy days when pollen tends to remain in the air longer. Use air conditioning when possible.  Many air conditioners have filters that trap the pollen spores. Use a HEPA room air filter to remove pollen form the indoor air you breathe.    Control of Mold Allergen Mold and fungi can grow on a variety of surfaces provided certain temperature and moisture conditions exist.  Outdoor molds grow on plants, decaying vegetation and soil.  The major outdoor mold, Alternaria  and Cladosporium, are found in very high numbers during hot and dry conditions.  Generally, a late Summer - Fall peak is seen for common outdoor fungal spores.  Rain will temporarily lower outdoor mold spore count, but counts rise rapidly when the rainy period ends.  The most important indoor molds are Aspergillus and Penicillium.  Dark, humid and poorly ventilated basements are ideal sites for mold growth.  The next most common sites of mold growth are the bathroom and the kitchen.  Outdoor [employer] Use air conditioning and keep windows closed Avoid exposure to decaying vegetation. Avoid leaf raking. Avoid grain handling. Consider wearing a face mask if working in moldy areas.  Indoor Mold Control Maintain humidity below 50%. Clean washable surfaces with 5% bleach solution. Remove sources e.g. Contaminated carpets.  Control of Dust Mite Allergen Dust mites play a major role in allergic asthma and rhinitis. They occur in environments with high humidity wherever human skin is found. Dust mites absorb humidity from the atmosphere (ie, they do not drink) and feed on organic matter (including shed human and animal skin). Dust mites are a microscopic type of insect that you cannot see with the naked eye. High levels of dust mites have been detected from mattresses, pillows, carpets, upholstered furniture, bed covers, clothes, soft toys and any woven material. The principal allergen of the dust mite is found in its feces. A gram of dust may contain 1,000 mites and 250,000 fecal particles. Mite antigen is easily measured in the air during house cleaning activities. Dust mites do  not bite and do not cause harm to humans, other than by triggering allergies/asthma.  Ways to decrease your exposure to dust mites in your home:  1. Encase mattresses, box springs and pillows with a mite-impermeable barrier or cover 2. Wash sheets, blankets and drapes weekly in hot water (130 F) with detergent and dry them  in a dryer on the hot setting. 3. Have the room cleaned frequently with a vacuum cleaner and a damp dust-mop. For carpeting or rugs, vacuuming with a vacuum cleaner equipped with a high-efficiency particulate air (HEPA) filter. The dust mite allergic individual should not be in a room which is being cleaned and should wait 1 hour after cleaning before going into the room. 4. Do not sleep on upholstered furniture (eg, couches). 5. If possible removing carpeting, upholstered furniture and drapery from the home is ideal. Horizontal blinds should be eliminated in the rooms where the person spends the most time (bedroom, study, television room). Washable vinyl, roller-type shades are optimal. 6. Remove all non-washable stuffed toys from the bedroom. Wash stuffed toys weekly like sheets and blankets above. 7. Reduce indoor humidity to less than 50%. Inexpensive humidity monitors can be purchased at most hardware stores. Do not use a humidifier as can make the problem worse and are not recommended.  Control of Dog or Cat Allergen Avoidance is the best way to manage a dog or cat allergy . If you have a dog or cat and are allergic to dog or cats, consider removing the dog or cat from the home. If you have a dog or cat but don't want to find it a new home, or if your family wants a pet even though someone in the household is allergic, here are some strategies that may help keep symptoms at bay:  Keep the pet out of your bedroom and restrict it to only a few rooms. Be advised that keeping the dog or cat in only one room will not limit the allergens to that room. Don't pet, hug or kiss the dog or cat; if you do, wash your hands with soap and water. High-efficiency particulate air (HEPA) cleaners run continuously in a bedroom or living room can reduce allergen levels over time. Regular use of a high-efficiency vacuum cleaner or a central vacuum can reduce allergen levels. Giving your dog or cat a bath at least once  a week can reduce airborne allergen.  Control of Cockroach Allergen Cockroach allergen has been identified as an important cause of acute attacks of asthma, especially in urban settings.  There are fifty-five species of cockroach that exist in the [place] , however only three, the American, German and Oriental species produce allergen that can affect patients with Asthma.  Allergens can be obtained from fecal particles, egg casings and secretions from cockroaches.    Remove food sources. Reduce access to water. Seal access and entry points. Spray runways with 0.5-1% Diazinon or Chlorpyrifos Blow boric acid power under stoves and refrigerator. Place bait stations (hydramethylnon) at feeding sites.

## 2024-06-23 NOTE — Progress Notes (Unsigned)
   [address] [place] [address] Dept: [phone]  FOLLOW UP NOTE  Patient ID: Cassandra Foley, female    DOB: 07-Oct-1995  Age: 28 y.o. MRN: [id number] Date of Office Visit: 06/24/2024  Assessment  Chief Complaint: No chief complaint on file.  HPI Cassandra Foley is a 28 year old female who presents to the clinic for a follow up visit. She was initially seen on this clinic on 10/25/2023 by Dr. Tobie for evaluation of asthma, allergic rhinitis, and food allergy  to shellfish, peanut , and tomato. Her last environmental allergy  skin testing was on 11/14/2023 and was positive to grass pollen, weed pollen, tree pollen, molds, cat, dog, feather, cockroach, and horse. Food allergy  testing via lab on 10/25/2023 was positive to shellfish, peanut , and tomato.   Discussed the use of AI scribe software for clinical note transcription with the patient, who gave verbal consent to proceed.  History of Present Illness      Drug Allergies:  Allergies  Allergen Reactions   Peanut -Containing Drug Products Anaphylaxis   Shellfish Allergy  Anaphylaxis   Tomato Anaphylaxis    Physical Exam: There were no vitals taken for this visit.   Physical Exam  Diagnostics:    Assessment and Plan: No diagnosis found.  No orders of the defined types were placed in this encounter.   There are no Patient Instructions on file for this visit.  No follow-ups on file.    Thank you for the opportunity to care for this patient.  Please do not hesitate to contact me with questions.  Arlean Mutter, FNP [facility]

## 2024-06-24 ENCOUNTER — Other Ambulatory Visit (HOSPITAL_COMMUNITY): Payer: Self-pay

## 2024-06-24 ENCOUNTER — Ambulatory Visit: Admitting: Family Medicine

## 2024-06-24 ENCOUNTER — Other Ambulatory Visit: Payer: Self-pay

## 2024-06-24 ENCOUNTER — Encounter: Payer: Self-pay | Admitting: Family Medicine

## 2024-06-24 VITALS — BP 130/70 | HR 76 | Temp 98.1°F | Resp 16

## 2024-06-24 DIAGNOSIS — J4540 Moderate persistent asthma, uncomplicated: Secondary | ICD-10-CM | POA: Diagnosis not present

## 2024-06-24 DIAGNOSIS — T7800XA Anaphylactic reaction due to unspecified food, initial encounter: Secondary | ICD-10-CM | POA: Insufficient documentation

## 2024-06-24 DIAGNOSIS — J3089 Other allergic rhinitis: Secondary | ICD-10-CM

## 2024-06-24 DIAGNOSIS — T7800XD Anaphylactic reaction due to unspecified food, subsequent encounter: Secondary | ICD-10-CM | POA: Diagnosis not present

## 2024-06-24 DIAGNOSIS — J302 Other seasonal allergic rhinitis: Secondary | ICD-10-CM | POA: Diagnosis not present

## 2024-06-24 MED ORDER — ALBUTEROL SULFATE (2.5 MG/3ML) 0.083% IN NEBU
2.5000 mg | INHALATION_SOLUTION | RESPIRATORY_TRACT | 1 refills | Status: AC | PRN
  Filled 2024-06-24: qty 75, 5d supply, fill #0

## 2024-06-24 MED ORDER — FLUTICASONE PROPIONATE 50 MCG/ACT NA SUSP
2.0000 | Freq: Every day | NASAL | 5 refills | Status: AC
  Filled 2024-06-24: qty 16, 30d supply, fill #0

## 2024-06-24 MED ORDER — ALBUTEROL SULFATE HFA 108 (90 BASE) MCG/ACT IN AERS
2.0000 | INHALATION_SPRAY | Freq: Four times a day (QID) | RESPIRATORY_TRACT | 2 refills | Status: AC | PRN
  Filled 2024-06-24: qty 6.7, 25d supply, fill #0

## 2024-06-24 MED ORDER — MONTELUKAST SODIUM 10 MG PO TABS
10.0000 mg | ORAL_TABLET | Freq: Every day | ORAL | 1 refills | Status: AC
  Filled 2024-06-24: qty 90, 90d supply, fill #0

## 2024-06-24 MED ORDER — FLUTICASONE PROPIONATE HFA 110 MCG/ACT IN AERO
2.0000 | INHALATION_SPRAY | Freq: Two times a day (BID) | RESPIRATORY_TRACT | 5 refills | Status: AC
  Filled 2024-06-24 – 2024-08-09 (×2): qty 12, 30d supply, fill #0

## 2024-06-24 MED ORDER — CETIRIZINE HCL 10 MG PO TABS
10.0000 mg | ORAL_TABLET | Freq: Every day | ORAL | 5 refills | Status: AC
  Filled 2024-06-24: qty 30, 30d supply, fill #0

## 2024-06-25 ENCOUNTER — Other Ambulatory Visit (HOSPITAL_COMMUNITY): Payer: Self-pay

## 2024-07-02 ENCOUNTER — Ambulatory Visit (INDEPENDENT_AMBULATORY_CARE_PROVIDER_SITE_OTHER): Admitting: Nurse Practitioner

## 2024-07-02 ENCOUNTER — Encounter (INDEPENDENT_AMBULATORY_CARE_PROVIDER_SITE_OTHER): Payer: Self-pay | Admitting: Nurse Practitioner

## 2024-07-02 VITALS — BP 129/82 | HR 87 | Temp 97.9°F | Ht 63.0 in | Wt 303.0 lb

## 2024-07-02 DIAGNOSIS — G4733 Obstructive sleep apnea (adult) (pediatric): Secondary | ICD-10-CM

## 2024-07-02 DIAGNOSIS — R7303 Prediabetes: Secondary | ICD-10-CM

## 2024-07-02 DIAGNOSIS — Z6841 Body Mass Index (BMI) 40.0 and over, adult: Secondary | ICD-10-CM

## 2024-07-02 DIAGNOSIS — E559 Vitamin D deficiency, unspecified: Secondary | ICD-10-CM

## 2024-07-02 DIAGNOSIS — E66813 Obesity, class 3: Secondary | ICD-10-CM | POA: Diagnosis not present

## 2024-07-02 NOTE — Progress Notes (Signed)
 Office: [phone]  /  Fax: [phone]  WEIGHT SUMMARY AND BIOMETRICS  Weight Lost Since Last Visit: 0  Weight Gained Since Last Visit: 7 lb   Vitals Temp: 97.9 F (36.6 C) BP: 129/82 Pulse Rate: 87 SpO2: 97 %   Anthropometric Measurements Height: 5' 3 (1.6 m) Weight: (!) 303 lb (137.4 kg) BMI (Calculated): 53.69 Weight at Last Visit: 296 lb Weight Lost Since Last Visit: 0 Weight Gained Since Last Visit: 7 lb Starting Weight: 317 lb Total Weight Loss (lbs): 14 lb (6.35 kg)   Body Composition  Body Fat %: 56.3 % Fat Mass (lbs): 170.8 lbs Muscle Mass (lbs): 126 lbs Visceral Fat Rating : 19   Other Clinical Data Fasting: no Labs: no Today's Visit #: 9 Starting Date: 11/22/23   Bio Impedance Data reviewed with patient: Gained 7 pounds. Muscle is down 11 pounds and adipose is up 18 pounds  HPI  Chief Complaint: OBESITY  Cassandra Foley is here to discuss her progress with her obesity treatment plan. She is on the the Category 2 Plan and states she is following her eating plan approximately 20 % of the time. She states she is not currently exercising.   Interval History:  Since last office visit she had finals last week and didn't go to gym, more stress, not much sleep. She works full time as programmer, systems at [employer] and goes to school full time at [employer]. She is seeing more energy. She has not been taking her Qysmia or Metformin  during busy time of finals She had tried to eat her protein(at least 100 grams) and had been drinking a lot of water - at least 64 ounces.  She has not been tempted by sweet  things brought in to her job. She is ready to restart her meds and focus on her nutrition plan and exercise.   Pharmacotherapy for weight loss: She is currently taking Qsymia  7.5/46 mg every day for medical weight loss, has been off for 2 weeks but has script at pharmacy to pick up.  Denies side effects such as numbness/tingling, brain fog, heart racing.  She is on  Metformin  XR 500 mg BID for prediabetes and denies side effects- has not been taking regularly, still has medication and will restart. Continues on Ergocalciferol  50000 units once a week for Vit D deficiency   PHYSICAL EXAM:  Blood pressure 129/82, pulse 87, temperature 97.9 F (36.6 C), height 5' 3 (1.6 m), weight (!) 303 lb (137.4 kg), SpO2 97%, currently breastfeeding. Body mass index is 53.67 kg/m.  General: Well Developed, well nourished, and in no acute distress.  HEENT: Normocephalic, atraumatic; EOMI, sclerae are anicteric. Skin: Warm and dry, good turgor Chest:  Normal excursion, shape, no gross ABN Respiratory: No conversational dyspnea; speaking in full sentences NeuroM-Sk:  Normal gross ROM * 4 extremities  Psych: A and O X 3, insight adequate, mood- full    DIAGNOSTIC DATA REVIEWED:  BMET    Component Value Date/Time   NA 140 11/22/2023 0936   K 4.4 11/22/2023 0936   CL 104 11/22/2023 0936   CO2 22 11/22/2023 0936   GLUCOSE 89 11/22/2023 0936   GLUCOSE 89 10/28/2008 1711   BUN 9 11/22/2023 0936   CREATININE 0.70 11/22/2023 0936   CALCIUM 8.9 11/22/2023 0936   Lab Results  Component Value Date   HGBA1C 5.9 (H) 11/22/2023   Lab Results  Component Value Date   INSULIN  18.1 11/22/2023   No results found for: TSH CBC  Component Value Date/Time   WBC 4.9 07/31/2016 0430   RBC 3.95 07/31/2016 0430   HGB 12.2 07/31/2016 0430   HGB 12.1 04/20/2016 1030   HCT 34.9 (L) 07/31/2016 0430   HCT 36.3 04/20/2016 1030   PLT 160 07/31/2016 0430   PLT 169 04/20/2016 1030   MCV 88.4 07/31/2016 0430   MCV 93 04/20/2016 1030   MCH 30.9 07/31/2016 0430   MCHC 35.0 07/31/2016 0430   RDW 14.1 07/31/2016 0430   RDW 14.9 04/20/2016 1030   Iron Studies No results found for: IRON, TIBC, FERRITIN, IRONPCTSAT Lipid Panel  No results found for: CHOL, TRIG, HDL, CHOLHDL, VLDL, LDLCALC, LDLDIRECT Hepatic Function Panel     Component Value  Date/Time   PROT 6.9 11/22/2023 0936   ALBUMIN 4.2 11/22/2023 0936   AST 14 11/22/2023 0936   ALT 17 11/22/2023 0936   ALKPHOS 64 11/22/2023 0936   BILITOT <0.2 11/22/2023 0936   No results found for: TSH Nutritional No results found for: VD25OH   ASSESSMENT AND PLAN  Class 3 severe obesity with serious comorbidity and body mass index (BMI) of 50.0 to 59.9 in adult, unspecified obesity type (HCC)  TREATMENT PLAN FOR OBESITY:  Recommended Dietary Goals  Cassandra Foley is currently in the action stage of change. As such, her goal is to continue weight management plan. She has agreed to the Category 2 Plan.  Behavioral Intervention  We discussed the following Behavioral Modification Strategies today: increasing fiber rich foods, avoiding skipping meals, work on managing stress, creating time for self-care and relaxation, celebration eating strategies, continue to work on maintaining a reduced calorie state, getting the recommended amount of protein, incorporating whole foods, making healthy choices, staying well hydrated and practicing mindfulness when eating., and increase protein intake, fibrous foods (25 grams per day for women, 30 grams for men) and water to improve satiety and decrease hunger signals. .  She wants to lose weight in December - She does  recognize that she must follow a structured plan and will eat before social situation to meet her protein goals and minimze party foods - She will give away food gifts unless they are very low calories or high protein - She will avoid calorie-containing liquids such as holiday drinks, eggnog and alcohol -She will stick to her structured plan strictly most of the time - This strategy should help her lose 1-2 pounds in December   Recommended Physical Activity Goals  Cassandra Foley has been advised to work up to 150 minutes of moderate intensity aerobic activity a week and strengthening exercises 2-3 times per week for cardiovascular health,  weight loss maintenance and preservation of muscle mass.   She has agreed to Increase volume of physical activity to a goal of 240 minutes a week and Combine aerobic and strengthening exercises for efficiency and improved cardiometabolic health.   Pharmacotherapy We discussed various medication options to help Cassandra Foley with her weight loss efforts and we both agreed to continue Qysmia 7.5/46 mg QD. I have consulted the [place] Controlled Substances Registry for this patient, and feel the risk/benefit ratio today is favorable for proceeding with this prescription for a controlled substance. No aberrancies noted. Patient was counseled on the importance of maintaining healthy lifestyle habits, including balanced nutrition, regular physical activity, and behavioral modifications, while taking antiobesity medication.  Stressed importance of taking medication regularly. Patient verbalized understanding that medication is an adjunct to, not a replacement for, lifestyle changes and that the long-term success and weight maintenance depend on  continued adherence to these strategies.   We reviewed indications, medication side effects including risk of teratogenicity.   She does have Nexplanon for birth control We reviewed policies and patient signed controlled antiobesity medicine agreement. We reviewed [place] Controlled Substance Database Vital signs are stable  ASSOCIATED CONDITIONS ADDRESSED TODAY  Action/Plan  Prediabetes Continue Category 2  meal plan, limit simple carbohydrates. Increase lean protein, fiber and water Restart Metformin  XR 500 mg BID, denies side effects with med.  Still has med at home and does not need refill Continue exercise with current goal of 240 minutes of moderate to high intensity exercise/week with strength training.  Continue to follow regularly with PCP   Vitamin D  deficiency      Continue to supplement with Ergocalciferol  50000 units once a week        Low vitamin D  levels can  be associated with adiposity and may result in leptin resistance and weight gain. Also associated with fatigue.       Currently on vitamin D  supplementation without any adverse effects such as nausea, vomiting or muscle weakness.    OSA (obstructive sleep apnea) -moderate to severe, HST 11/2023, not started CPAP due to cost       Loss of 10-15% body weight can help improve OSA        Continue Category 2 meal plan and exercise           Return in about 4 weeks (around 07/30/2024).SABRA She was informed of the importance of frequent follow up visits to maximize her success with intensive lifestyle modifications for her multiple health conditions.   ATTESTASTION STATEMENTS:  Reviewed by clinician on day of visit: allergies, medications, problem list, medical history, surgical history, family history, social history, and previous encounter notes.   I personally spent a total of 30 minutes in the care of the patient today including preparing to see the patient, getting/reviewing separately obtained history, performing a medically appropriate exam/evaluation, counseling and educating, and documenting clinical information in the EHR.   Jan Olano ANP-C

## 2024-08-08 ENCOUNTER — Encounter (INDEPENDENT_AMBULATORY_CARE_PROVIDER_SITE_OTHER): Payer: Self-pay | Admitting: Internal Medicine

## 2024-08-08 ENCOUNTER — Other Ambulatory Visit (HOSPITAL_COMMUNITY): Payer: Self-pay

## 2024-08-08 ENCOUNTER — Ambulatory Visit (INDEPENDENT_AMBULATORY_CARE_PROVIDER_SITE_OTHER): Admitting: Internal Medicine

## 2024-08-08 DIAGNOSIS — E66813 Obesity, class 3: Secondary | ICD-10-CM | POA: Diagnosis not present

## 2024-08-08 DIAGNOSIS — Z6841 Body Mass Index (BMI) 40.0 and over, adult: Secondary | ICD-10-CM

## 2024-08-08 DIAGNOSIS — R7303 Prediabetes: Secondary | ICD-10-CM

## 2024-08-08 MED ORDER — PHENTERMINE-TOPIRAMATE ER 7.5-46 MG PO CP24
1.0000 | ORAL_CAPSULE | Freq: Every day | ORAL | 1 refills | Status: DC
  Filled 2024-08-08: qty 30, 30d supply, fill #0

## 2024-08-08 MED ORDER — METFORMIN HCL ER 500 MG PO TB24
500.0000 mg | ORAL_TABLET | Freq: Two times a day (BID) | ORAL | 0 refills | Status: AC
  Filled 2024-08-08: qty 180, 90d supply, fill #0

## 2024-08-08 NOTE — Assessment & Plan Note (Signed)
 Managed with metformin  1 tablet twice daily. Reports no significant side effects from metformin . Discussed benefits of metformin  in diabetes prevention, lowering insulin  levels, and improving gut bacteria. Current blood pressure and heart rate are well-controlled, indicating no adverse effects from Qsymia . - Continue metformin  1 tablet twice daily. - Refilled metformin  prescription.

## 2024-08-08 NOTE — Assessment & Plan Note (Signed)
 Weight: decrease of 22 lb (6.9%) over 9 months, 3 weeks  Start: 10/17/2023 320 lb (145.2 kg) (H)  End: 08/08/2024 298 lb (135.2 kg)

## 2024-08-08 NOTE — Progress Notes (Signed)
 "  Office: [phone]  /  Fax: [phone]  Weight Summary and Body Composition Analysis (BIA)  Vitals Temp: 98.7 F (37.1 C) BP: 114/79 Pulse Rate: 90 SpO2: 99 %   Anthropometric Measurements Height: 5' 3 (1.6 m) Weight: 298 lb (135.2 kg) BMI (Calculated): 52.8 Weight at Last Visit: 303 lb Weight Lost Since Last Visit: 5 lb Weight Gained Since Last Visit: 0 lb Starting Weight: 317 lb Total Weight Loss (lbs): 19 lb (8.618 kg)   Body Composition  Body Fat %: 52.3 % Fat Mass (lbs): 156 lbs Muscle Mass (lbs): 135 lbs Total Body Water (lbs): 104.4 lbs Visceral Fat Rating : 18    RMR: 1483  Today's Visit #: 10  No data recorded  Subjective   Chief Complaint: Obesity  Interval History  Discussed the use of AI scribe software for clinical note transcription with the patient, who gave verbal consent to proceed.  History of Present Illness Cassandra Foley is a 29 year old female with abnormal metabolic rate, sleep apnea, and prediabetes who presents for medical weight management.  She is currently on Qsymia  7.5 mg/45 mg, started in September 2025, with the current dose since October 2025. She also takes metformin , one tablet twice daily. Since her last visit, she has lost five pounds.  She follows a 1500 calorie reduced nutrition plan approximately 70% of the time and exercises three days a week for about 90 minutes, focusing on cardio and strengthening exercises. She feels good about her eating habits and feels satisfied after meals without experiencing hunger between meals.  She experiences occasional insomnia and usually takes her Qsymia  after her first meal in the morning. No other side effects from the medication are noted.  Her clothes fit differently, particularly around her waist, and she feels stronger during workouts. She does not have a workout buddy but maintains accountability by going to the gym regularly.  She is concerned about balancing her  routine during stressful periods, such as exams, which previously led to weight gain. She is interested in strategies to maintain her weight loss progress during busy times, including using meal replacements and prepackaged meals to manage calorie intake when unable to meal prep.     Challenges affecting patient progress: none.    Pharmacotherapy for weight management: She is currently taking Qsymia  with adequate clinical response  and without side effects..   Assessment and Plan   Treatment Plan For Obesity:  Recommended Dietary Goals  Cassandra Foley is currently in the action stage of change. As such, her goal is to continue weight management plan. She has agreed to: continue current reduced-calorie meal plan  Behavioral Health and Counseling  We discussed the following behavioral modification strategies today: continue to work on maintaining a reduced calorie state, getting the recommended amount of protein, incorporating whole foods, making healthy choices, staying well hydrated and practicing mindfulness when eating. and increase protein intake, fibrous foods (25 grams per day for women, 30 grams for men) and water to improve satiety and decrease hunger signals. .  Additional education and resources provided today: None  Recommended Physical Activity Goals  Cassandra Foley has been advised to work up to 150 minutes of moderate intensity aerobic activity a week and strengthening exercises 2-3 times per week for cardiovascular health, weight loss maintenance and preservation of muscle mass.  She has agreed to :  Think about enjoyable ways to increase daily physical activity and overcoming barriers to exercise, Increase physical activity in their day and reduce sedentary time (  increase NEAT)., Increase volume of physical activity to a goal of 240 minutes a week, and Combine aerobic and strengthening exercises for efficiency and improved cardiometabolic health.  Medical Interventions and  Pharmacotherapy  We discussed various medication options to help Cassandra Foley with her weight loss efforts and we both agreed to : Adequate clinical response to anti-obesity medication, continue current anti-obesity regimen  Associated Conditions Impacted by Obesity Treatment  Assessment & Plan Class 3 severe obesity with serious comorbidity and body mass index (BMI) of 50.0 to 59.9 in adult, unspecified obesity type (HCC) Weight: decrease of 22 lb (6.9%) over 9 months, 3 weeks  Start: 10/17/2023 320 lb (145.2 kg) (H)  End: 08/08/2024 298 lb (135.2 kg)   Prediabetes Managed with metformin  1 tablet twice daily. Reports no significant side effects from metformin . Discussed benefits of metformin  in diabetes prevention, lowering insulin  levels, and improving gut bacteria. Current blood pressure and heart rate are well-controlled, indicating no adverse effects from Qsymia . - Continue metformin  1 tablet twice daily. - Refilled metformin  prescription. Class 3 severe obesity with serious comorbidity and body mass index (BMI) of 50.0 to 59.9 in adult Cassandra Foley) Managed with Qsymia  (phentermine /topiramate ) 7.5 mg/45 mg since October 2024. She has lost 5 pounds since the last visit, following a 1500 calorie diet and exercising 3 days a week. Reports good appetite control and satisfaction with current eating habits. Occasional insomnia likely due to phentermine  component of Qsymia . No need to increase Qsymia  dose as weight loss is steady and appetite is well-controlled. Discussed potential future use of GLP-1 agonists as pricing decreases. - Continue Qsymia  7.5 mg/45 mg daily. - Provided new meal plan targeting 1500 calories with macronutrient distribution of 30% protein, 40% carbohydrates, 30% fat. - Provided handout on meal replacements and guide on healthy eating. - Advised to avoid additional stimulants like coffee and energy drinks after 3 PM to manage insomnia. - Discussed potential future use of GLP-1 agonists as  pricing decreases.         Objective   Physical Exam:  Blood pressure 114/79, pulse 90, temperature 98.7 F (37.1 C), height 5' 3 (1.6 m), weight 298 lb (135.2 kg), SpO2 99%, currently breastfeeding. Body mass index is 52.79 kg/m.  General: She is overweight, cooperative, alert, well developed, and in no acute distress. PSYCH: Has normal mood, affect and thought process.   HEENT: EOMI, sclerae are anicteric. Lungs: Normal breathing effort, no conversational dyspnea. Extremities: No edema.  Neurologic: No gross sensory or motor deficits. No tremors or fasciculations noted.    Diagnostic Data Reviewed:  BMET    Component Value Date/Time   NA 140 11/22/2023 0936   K 4.4 11/22/2023 0936   CL 104 11/22/2023 0936   CO2 22 11/22/2023 0936   GLUCOSE 89 11/22/2023 0936   GLUCOSE 89 10/28/2008 1711   BUN 9 11/22/2023 0936   CREATININE 0.70 11/22/2023 0936   CALCIUM 8.9 11/22/2023 0936   Lab Results  Component Value Date   HGBA1C 5.9 (H) 11/22/2023   Lab Results  Component Value Date   INSULIN  18.1 11/22/2023   No results found for: TSH CBC    Component Value Date/Time   WBC 4.9 07/31/2016 0430   RBC 3.95 07/31/2016 0430   HGB 12.2 07/31/2016 0430   HGB 12.1 04/20/2016 1030   HCT 34.9 (L) 07/31/2016 0430   HCT 36.3 04/20/2016 1030   PLT 160 07/31/2016 0430   PLT 169 04/20/2016 1030   MCV 88.4 07/31/2016 0430   MCV 93  04/20/2016 1030   MCH 30.9 07/31/2016 0430   MCHC 35.0 07/31/2016 0430   RDW 14.1 07/31/2016 0430   RDW 14.9 04/20/2016 1030   Iron Studies No results found for: IRON, TIBC, FERRITIN, IRONPCTSAT Lipid Panel  No results found for: CHOL, TRIG, HDL, CHOLHDL, VLDL, LDLCALC, LDLDIRECT Hepatic Function Panel     Component Value Date/Time   PROT 6.9 11/22/2023 0936   ALBUMIN 4.2 11/22/2023 0936   AST 14 11/22/2023 0936   ALT 17 11/22/2023 0936   ALKPHOS 64 11/22/2023 0936   BILITOT <0.2 11/22/2023 0936   No results  found for: TSH Nutritional No results found for: VD25OH  Medications: Outpatient Encounter Medications as of 08/08/2024  Medication Sig   albuterol  (PROVENTIL ) (2.5 MG/3ML) 0.083% nebulizer solution Take 3 mLs (2.5 mg total) by nebulization every 4 (four) hours as needed for wheezing or shortness of breath.   albuterol  (VENTOLIN  HFA) 108 (90 Base) MCG/ACT inhaler Inhale 2 puffs into the lungs every 6 (six) hours as needed for wheezing or shortness of breath.   azelastine  (ASTELIN ) 0.1 % nasal spray Place 2 sprays into both nostrils 2 (two) times daily as needed.   cetirizine  (ZYRTEC ) 10 MG tablet Take 1 tablet (10 mg total) by mouth daily.   diphenhydrAMINE  (BENADRYL ) 25 mg capsule Take 25 mg by mouth every 6 (six) hours as needed for allergies.   EPINEPHrine  0.3 mg/0.3 mL IJ SOAJ injection Inject contents of pen as needed for allergic reaction   ergocalciferol  (VITAMIN D2) 1.25 MG (50000 UT) capsule Take 1 capsule (50,000 Units total) by mouth once a week.   etonogestrel (NEXPLANON) 68 MG IMPL implant insert implant and remove in 3 years   fluticasone  (FLONASE ) 50 MCG/ACT nasal spray Place 2 sprays into both nostrils daily.   fluticasone  (FLOVENT  HFA) 110 MCG/ACT inhaler Inhale 2 puffs into the lungs in the morning and at bedtime.   ibuprofen  (ADVIL ,MOTRIN ) 600 MG tablet Take 1 tablet (600 mg total) by mouth every 6 (six) hours.   metFORMIN  (GLUCOPHAGE -XR) 500 MG 24 hr tablet Take 1 tablet (500 mg total) by mouth 2 (two) times daily with a meal.   montelukast  (SINGULAIR ) 10 MG tablet Take 1 tablet (10 mg total) by mouth daily.   Multiple Vitamins-Minerals (MULTIVITAMIN WITH MINERALS) tablet Take 1 tablet by mouth daily.   Phentermine -Topiramate  ER 7.5-46 MG CP24 Take 1 capsule by mouth daily.   Vitamin D , Ergocalciferol , (DRISDOL ) 1.25 MG (50000 UNIT) CAPS capsule Take 50,000 Units by mouth once a week.   No facility-administered encounter medications on file as of 08/08/2024.      Follow-Up   No follow-ups on file.SABRA She was informed of the importance of frequent follow up visits to maximize her success with intensive lifestyle modifications for her multiple health conditions.  Attestation Statement   Reviewed by clinician on day of visit: allergies, medications, problem list, medical history, surgical history, family history, social history, and previous encounter notes.     Lucas Parker, MD  "

## 2024-08-08 NOTE — Assessment & Plan Note (Signed)
 Managed with Qsymia  (phentermine /topiramate ) 7.5 mg/45 mg since October 2024. She has lost 5 pounds since the last visit, following a 1500 calorie diet and exercising 3 days a week. Reports good appetite control and satisfaction with current eating habits. Occasional insomnia likely due to phentermine  component of Qsymia . No need to increase Qsymia  dose as weight loss is steady and appetite is well-controlled. Discussed potential future use of GLP-1 agonists as pricing decreases. - Continue Qsymia  7.5 mg/45 mg daily. - Provided new meal plan targeting 1500 calories with macronutrient distribution of 30% protein, 40% carbohydrates, 30% fat. - Provided handout on meal replacements and guide on healthy eating. - Advised to avoid additional stimulants like coffee and energy drinks after 3 PM to manage insomnia. - Discussed potential future use of GLP-1 agonists as pricing decreases.

## 2024-08-09 ENCOUNTER — Other Ambulatory Visit (HOSPITAL_COMMUNITY): Payer: Self-pay

## 2024-08-09 ENCOUNTER — Telehealth (HOSPITAL_COMMUNITY): Payer: Self-pay

## 2024-08-09 ENCOUNTER — Other Ambulatory Visit: Payer: Self-pay

## 2024-08-09 NOTE — Telephone Encounter (Signed)
 PA request has been Received. New Encounter has been or will be created for follow up. For additional info see Pharmacy Prior Auth telephone encounter from 08/09/24.

## 2024-08-09 NOTE — Telephone Encounter (Signed)
 Pharmacy Patient Advocate Encounter  Received notification from [facility] that Prior Authorization for Phentermine -Topiramate  ER 7.5-46MG  er capsules  has been APPROVED from 08/09/24 to 08/08/25. Ran test claim, Copay is $146.29. This test claim was processed through [facility]- copay amounts may vary at other pharmacies due to pharmacy/plan contracts, or as the patient moves through the different stages of their insurance plan.   PA #/Case ID/Reference #: [id number]  *copay is high because patient has a $1700 deductible

## 2024-08-10 ENCOUNTER — Other Ambulatory Visit (HOSPITAL_COMMUNITY): Payer: Self-pay

## 2024-08-11 ENCOUNTER — Other Ambulatory Visit (HOSPITAL_COMMUNITY): Payer: Self-pay

## 2024-08-15 ENCOUNTER — Encounter (INDEPENDENT_AMBULATORY_CARE_PROVIDER_SITE_OTHER): Payer: Self-pay | Admitting: Internal Medicine

## 2024-08-15 ENCOUNTER — Other Ambulatory Visit (HOSPITAL_COMMUNITY): Payer: Self-pay

## 2024-08-15 DIAGNOSIS — E66813 Obesity, class 3: Secondary | ICD-10-CM

## 2024-08-15 DIAGNOSIS — G4733 Obstructive sleep apnea (adult) (pediatric): Secondary | ICD-10-CM

## 2024-08-15 DIAGNOSIS — R7303 Prediabetes: Secondary | ICD-10-CM

## 2024-08-19 ENCOUNTER — Other Ambulatory Visit (HOSPITAL_COMMUNITY): Payer: Self-pay

## 2024-08-19 MED ORDER — PHENTERMINE HCL 37.5 MG PO TABS
18.7500 mg | ORAL_TABLET | Freq: Every day | ORAL | 0 refills | Status: AC
  Filled 2024-08-19: qty 15, 30d supply, fill #0

## 2024-08-20 ENCOUNTER — Other Ambulatory Visit (HOSPITAL_COMMUNITY): Payer: Self-pay

## 2024-09-02 ENCOUNTER — Ambulatory Visit: Admitting: Family Medicine

## 2024-09-19 ENCOUNTER — Ambulatory Visit (INDEPENDENT_AMBULATORY_CARE_PROVIDER_SITE_OTHER): Admitting: Internal Medicine
